# Patient Record
Sex: Female | Born: 2000 | Race: Black or African American | Hispanic: No | Marital: Single | Smoking: Current every day smoker
Health system: Southern US, Community
[De-identification: ages and names within clinical notes are randomized; demographics above are authoritative.]

## PROBLEM LIST (undated history)

## (undated) DIAGNOSIS — F909 Attention-deficit hyperactivity disorder, unspecified type: Secondary | ICD-10-CM

## (undated) DIAGNOSIS — Z593 Problems related to living in residential institution: Secondary | ICD-10-CM

## (undated) DIAGNOSIS — L600 Ingrowing nail: Secondary | ICD-10-CM

## (undated) DIAGNOSIS — I10 Essential (primary) hypertension: Secondary | ICD-10-CM

## (undated) DIAGNOSIS — M6740 Ganglion, unspecified site: Secondary | ICD-10-CM

## (undated) DIAGNOSIS — N760 Acute vaginitis: Secondary | ICD-10-CM

## (undated) DIAGNOSIS — Z3009 Encounter for other general counseling and advice on contraception: Principal | ICD-10-CM

## (undated) DIAGNOSIS — F419 Anxiety disorder, unspecified: Secondary | ICD-10-CM

## (undated) DIAGNOSIS — R443 Hallucinations, unspecified: Secondary | ICD-10-CM

## (undated) DIAGNOSIS — F209 Schizophrenia, unspecified: Secondary | ICD-10-CM

## (undated) DIAGNOSIS — B9689 Other specified bacterial agents as the cause of diseases classified elsewhere: Secondary | ICD-10-CM

## (undated) HISTORY — DX: Ingrowing nail: L60.0

## (undated) HISTORY — DX: Ganglion, unspecified site: M67.40

## (undated) HISTORY — PX: ABDOMINAL SURGERY: SHX537

## (undated) HISTORY — DX: Acute vaginitis: N76.0

## (undated) HISTORY — DX: Anxiety disorder, unspecified: F41.9

## (undated) HISTORY — DX: Other specified bacterial agents as the cause of diseases classified elsewhere: B96.89

## (undated) HISTORY — DX: Problems related to living in residential institution: Z59.3

## (undated) HISTORY — DX: Encounter for other general counseling and advice on contraception: Z30.09

---
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
## 2019-01-14 ENCOUNTER — Other Ambulatory Visit: Payer: Medicaid Other

## 2019-01-14 ENCOUNTER — Other Ambulatory Visit: Payer: Self-pay

## 2019-01-14 DIAGNOSIS — N898 Other specified noninflammatory disorders of vagina: Secondary | ICD-10-CM

## 2019-01-23 LAB — NUSWAB VAGINITIS PLUS (VG+)
Atopobium vaginae: HIGH Score — AB
BVAB 2: HIGH Score — AB
Candida albicans, NAA: NEGATIVE
Candida glabrata, NAA: NEGATIVE
Chlamydia trachomatis, NAA: NEGATIVE
Megasphaera 1: HIGH Score — AB
Neisseria gonorrhoeae, NAA: NEGATIVE
Trich vag by NAA: NEGATIVE

## 2019-01-24 ENCOUNTER — Telehealth: Payer: Self-pay | Admitting: Adult Health

## 2019-01-24 MED ORDER — METRONIDAZOLE 500 MG PO TABS: 500.0000 mg | ORAL_TABLET | Freq: Two times a day (BID) | ORAL | 0 refills | Status: DC

## 2019-01-24 NOTE — Telephone Encounter (Signed)
Left message that nuswab +BV, negative for yeast,trich,GC and chlamydia, rx sent for flagyl to [facility]

## 2019-01-27 ENCOUNTER — Encounter (HOSPITAL_COMMUNITY): Payer: Self-pay | Admitting: Emergency Medicine

## 2019-01-27 ENCOUNTER — Other Ambulatory Visit: Payer: Self-pay

## 2019-01-27 ENCOUNTER — Emergency Department (HOSPITAL_COMMUNITY)
Admission: EM | Admit: 2019-01-27 | Discharge: 2019-01-28 | Disposition: A | Discharge status: Other Acute Inpatient Hospital | Payer: Medicaid Other | Attending: Emergency Medicine | Admitting: Emergency Medicine

## 2019-01-27 DIAGNOSIS — I10 Essential (primary) hypertension: Secondary | ICD-10-CM | POA: Insufficient documentation

## 2019-01-27 DIAGNOSIS — F17210 Nicotine dependence, cigarettes, uncomplicated: Secondary | ICD-10-CM | POA: Diagnosis not present

## 2019-01-27 DIAGNOSIS — R45851 Suicidal ideations: Secondary | ICD-10-CM | POA: Diagnosis not present

## 2019-01-27 DIAGNOSIS — Z03818 Encounter for observation for suspected exposure to other biological agents ruled out: Secondary | ICD-10-CM | POA: Insufficient documentation

## 2019-01-27 DIAGNOSIS — F99 Mental disorder, not otherwise specified: Secondary | ICD-10-CM | POA: Diagnosis present

## 2019-01-27 DIAGNOSIS — F332 Major depressive disorder, recurrent severe without psychotic features: Secondary | ICD-10-CM | POA: Insufficient documentation

## 2019-01-27 DIAGNOSIS — F902 Attention-deficit hyperactivity disorder, combined type: Secondary | ICD-10-CM | POA: Insufficient documentation

## 2019-01-27 LAB — CBC
HCT: 40.2 % (ref 36.0–46.0)
Hemoglobin: 12.4 g/dL (ref 12.0–15.0)
MCH: 27.4 pg (ref 26.0–34.0)
MCHC: 30.8 g/dL (ref 30.0–36.0)
MCV: 88.9 fL (ref 80.0–100.0)
Platelets: 291 10*3/uL (ref 150–400)
RBC: 4.52 MIL/uL (ref 3.87–5.11)
RDW: 14.2 % (ref 11.5–15.5)
WBC: 9.4 10*3/uL (ref 4.0–10.5)
nRBC: 0 % (ref 0.0–0.2)

## 2019-01-27 LAB — ACETAMINOPHEN LEVEL: Acetaminophen (Tylenol), Serum: <10 ug/mL — ABNORMAL LOW (ref 10–30)

## 2019-01-27 LAB — COMPREHENSIVE METABOLIC PANEL
ALT: 23 U/L (ref 0–44)
AST: 22 U/L (ref 15–41)
Albumin: 4.4 g/dL (ref 3.5–5.0)
Alkaline Phosphatase: 69 U/L (ref 38–126)
Anion gap: 9 (ref 5–15)
BUN: 18 mg/dL (ref 6–20)
CO2: 26 mmol/L (ref 22–32)
Calcium: 9.4 mg/dL (ref 8.9–10.3)
Chloride: 106 mmol/L (ref 98–111)
Creatinine, Ser: 0.7 mg/dL (ref 0.44–1.00)
GFR calc Af Amer: >60 mL/min (ref >60)
GFR calc non Af Amer: >60 mL/min (ref >60)
Glucose, Bld: 114 mg/dL — ABNORMAL HIGH (ref 70–99)
Potassium: 3.6 mmol/L (ref 3.5–5.1)
Sodium: 141 mmol/L (ref 135–145)
Total Bilirubin: 0.3 mg/dL (ref 0.3–1.2)
Total Protein: 7.7 g/dL (ref 6.5–8.1)

## 2019-01-27 LAB — ETHANOL: Alcohol, Ethyl (B): <10 mg/dL (ref <10)

## 2019-01-27 LAB — SALICYLATE LEVEL: Salicylate Lvl: <7 mg/dL (ref 2.8–30.0)

## 2019-01-27 NOTE — ED Notes (Signed)
Pt belongings secured in locker in Pt belonging room

## 2019-01-27 NOTE — ED Notes (Signed)
Pt reports she has been unable to take any medications for ADHD and Depression but has been unable to have medications refilled since last year, and her recent prescription for Flagyl was sent to her grandmothers house where she was kicked out of and unable to retrieve her medication. Pt reports she has not seen a psychiatrist in a year and is currently stating she wants to kill herself by overdosing on Tylenol.

## 2019-01-27 NOTE — Progress Notes (Addendum)
Paige Romp, NP recommends inpt tx. [place] to review for possible admission pending Covid testing results per [facility]. Pt's nurse [place], Rudell Cobb, RN and EDP Veryl Speak, MD have been advised.   RN states he is going to order rapid covid testing.  Lind Covert, MSW, LCSW Therapeutic Triage Specialist  [phone]
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
## 2019-01-27 NOTE — ED Triage Notes (Signed)
Pt states she is having suicidal thoughts. States she has a plan to OD on pills.

## 2019-01-27 NOTE — ED Provider Notes (Signed)
[facility] EMERGENCY DEPARTMENT Provider Note   CSN: [id number] Arrival date & time: 01/27/19  2222     History   Chief Complaint Chief Complaint  Patient presents with  . V70.1  . Depression    HPI Paige Wright is a 18 y.o. female.     Patient is a 18 year old female with past medical history of schizophrenia, ADHD, and anxiety.  She presents today for evaluation of suicidal ideation.  She has been off of her medications for several months.  She tells me she has "a lot going on".  She describes issues with finances, family, and personal relationships as stressors.  She denies any drug or alcohol use.  The history is provided by the patient.  Depression This is a recurrent problem. Episode onset: Several days ago. The problem occurs constantly. The problem has been rapidly worsening. Nothing aggravates the symptoms. Nothing relieves the symptoms. She has tried nothing for the symptoms.    Past Medical History:  Diagnosis Date  . ADHD (attention deficit hyperactivity disorder)   . Anxiety   . Bacterial vaginosis 09/06/2014  . Contraceptive education 11/01/2013  . Hallucination   . Hypertension   . Ingrown left greater toenail 05/20/2013  . Lives in group home 10/04/2015  . Schizophrenia [facility](HCC)     Patient Active Problem List   Diagnosis Date Noted  . Encounter for Nexplanon removal 03/18/2017  . Missed periods 03/18/2017  . Death of parent 10/04/2015  . Schizoaffective disorder (HCC) 07/29/2015  . Screening examination for STD (sexually transmitted disease) 11/01/2013  . ADHD (attention deficit hyperactivity disorder), combined type 11/08/2012  . Major depressive disorder, recurrent episode, moderate (HCC) 11/05/2012  . Generalized anxiety disorder 11/05/2012    History reviewed. No pertinent surgical history.   OB History    Gravida  0   Para  0   Term  0   Preterm  0   AB  0   Living  0     SAB  0   TAB  0   Ectopic  0   Multiple  0   Live Births  0            Home Medications    Prior to Admission medications   Medication Sig Start Date End Date Taking? Authorizing Provider  metroNIDAZOLE (FLAGYL) 500 MG tablet Take 1 tablet (500 mg total) by mouth 2 (two) times daily. 01/24/19   Adline PotterGriffin, Jennifer A, NP    Family History Family History  Problem Relation Age of Onset  . HIV/AIDS Mother   . Mental illness Mother   . Hypertension Mother   . Kidney disease Mother   . Mental illness Father   . HIV/AIDS Father   . Asthma Sister   . Diabetes Maternal Grandmother   . Hypertension Maternal Grandmother   . Hypertension Maternal Grandfather   . Mental illness Paternal Grandmother   . Mental illness Paternal Grandfather   . Mental illness Maternal Aunt   . Hypertension Maternal Aunt   . Diabetes Maternal Aunt   . Cancer Maternal Aunt   . Mental illness Paternal Aunt   . Mental illness Paternal Uncle     Social History Social History   Tobacco Use  . Smoking status: Current Some Day Smoker    Types: Cigarettes  . Smokeless tobacco: Never Used  Substance Use Topics  . Alcohol use: Yes    Comment: occ  . Drug use: Not Currently    Types: Marijuana  Allergies   Patient has no known allergies.   Review of Systems Review of Systems  Psychiatric/Behavioral: Positive for depression.  All other systems reviewed and are negative.    Physical Exam Updated Vital Signs BP 135/88 (BP Location: Left Wrist)   Pulse 88   Temp 97.7 F (36.5 C) (Temporal)   Resp 18   Ht 5' 7.5" (1.715 m)   Wt 98.7 kg   LMP 11/20/2018   SpO2 98%   BMI 33.57 kg/m   Physical Exam Vitals signs and nursing note reviewed.  Constitutional:      General: She is not in acute distress.    Appearance: She is well-developed. She is not diaphoretic.  HENT:     Head: Normocephalic and atraumatic.  Neck:     Musculoskeletal: Normal range of motion and neck supple.  Cardiovascular:     Rate and Rhythm: Normal rate and  regular rhythm.     Heart sounds: No murmur. No friction rub. No gallop.   Pulmonary:     Effort: Pulmonary effort is normal. No respiratory distress.     Breath sounds: Normal breath sounds. No wheezing.  Abdominal:     General: Bowel sounds are normal. There is no distension.     Palpations: Abdomen is soft.     Tenderness: There is no abdominal tenderness.  Musculoskeletal: Normal range of motion.  Skin:    General: Skin is warm and dry.  Neurological:     Mental Status: She is alert and oriented to person, place, and time.  Psychiatric:        Attention and Perception: Attention and perception normal.        Mood and Affect: Mood normal.        Speech: Speech normal.        Behavior: Behavior normal.        Thought Content: Thought content includes suicidal ideation. Thought content does not include homicidal ideation. Thought content includes suicidal plan. Thought content does not include homicidal plan.        Cognition and Memory: Cognition normal.        Judgment: Judgment is impulsive.      ED Treatments / Results  Labs (all labs ordered are listed, but only abnormal results are displayed) Labs Reviewed  CBC  COMPREHENSIVE METABOLIC PANEL  ETHANOL  SALICYLATE LEVEL  ACETAMINOPHEN LEVEL  RAPID URINE DRUG SCREEN, HOSP PERFORMED  POC URINE PREG, ED    EKG    Radiology No results found.  Procedures Procedures (including critical care time)  Medications Ordered in ED Medications - No data to display   Initial Impression / Assessment and Plan / ED Course  I have reviewed the triage vital signs and the nursing notes.  Pertinent labs & imaging results that were available during my care of the patient were reviewed by me and considered in my medical decision making (see chart for details).  Patient medically cleared.  She has been evaluated by TTS who feels as though she meets inpatient criteria.  Patient will go to behavioral health for further treatment.   Final Clinical Impressions(s) / ED Diagnoses   Final diagnoses:  None    ED Discharge Orders    None       Veryl Speak, MD 01/28/19 [phone]

## 2019-01-28 ENCOUNTER — Encounter (HOSPITAL_COMMUNITY): Payer: Self-pay

## 2019-01-28 ENCOUNTER — Inpatient Hospital Stay (HOSPITAL_COMMUNITY)
Admission: AD | Admit: 2019-01-28 | Discharge: 2019-02-03 | DRG: 885 | Disposition: A | Discharge status: Home / Self Care | Payer: Medicaid Other | Source: Intra-hospital | Attending: Psychiatry | Admitting: Psychiatry

## 2019-01-28 DIAGNOSIS — Z830 Family history of human immunodeficiency virus [HIV] disease: Secondary | ICD-10-CM

## 2019-01-28 DIAGNOSIS — F1210 Cannabis abuse, uncomplicated: Secondary | ICD-10-CM | POA: Diagnosis present

## 2019-01-28 DIAGNOSIS — Z915 Personal history of self-harm: Secondary | ICD-10-CM | POA: Diagnosis not present

## 2019-01-28 DIAGNOSIS — F332 Major depressive disorder, recurrent severe without psychotic features: Principal | ICD-10-CM | POA: Diagnosis present

## 2019-01-28 DIAGNOSIS — F1010 Alcohol abuse, uncomplicated: Secondary | ICD-10-CM | POA: Diagnosis present

## 2019-01-28 DIAGNOSIS — R45851 Suicidal ideations: Secondary | ICD-10-CM | POA: Diagnosis present

## 2019-01-28 DIAGNOSIS — Z841 Family history of disorders of kidney and ureter: Secondary | ICD-10-CM

## 2019-01-28 DIAGNOSIS — Z8249 Family history of ischemic heart disease and other diseases of the circulatory system: Secondary | ICD-10-CM | POA: Diagnosis not present

## 2019-01-28 DIAGNOSIS — Z818 Family history of other mental and behavioral disorders: Secondary | ICD-10-CM | POA: Diagnosis not present

## 2019-01-28 DIAGNOSIS — Z833 Family history of diabetes mellitus: Secondary | ICD-10-CM

## 2019-01-28 DIAGNOSIS — Z87891 Personal history of nicotine dependence: Secondary | ICD-10-CM

## 2019-01-28 LAB — POC URINE PREG, ED: Preg Test, Ur: NEGATIVE

## 2019-01-28 LAB — RAPID URINE DRUG SCREEN, HOSP PERFORMED
Amphetamines: NOT DETECTED
Barbiturates: NOT DETECTED
Benzodiazepines: NOT DETECTED
Cocaine: NOT DETECTED
Opiates: NOT DETECTED
Tetrahydrocannabinol: POSITIVE — AB

## 2019-01-28 LAB — SARS CORONAVIRUS 2 BY RT PCR (HOSPITAL ORDER, PERFORMED IN ~~LOC~~ HOSPITAL LAB): SARS Coronavirus 2: NEGATIVE

## 2019-01-28 MED ORDER — HYDROXYZINE HCL 25 MG PO TABS
25.0000 mg | ORAL_TABLET | Freq: Four times a day (QID) | ORAL | Status: AC | PRN
  Administered 2019-01-28 – 2019-01-29 (×3): 25 mg via ORAL
  Filled 2019-01-28 (×4): qty 1

## 2019-01-28 MED ORDER — VITAMIN B-1 100 MG PO TABS
100.0000 mg | ORAL_TABLET | Freq: Every day | ORAL | Status: DC
  Administered 2019-01-29 – 2019-02-03 (×6): 100 mg via ORAL
  Filled 2019-01-28 (×9): qty 1

## 2019-01-28 MED ORDER — LOPERAMIDE HCL 2 MG PO CAPS: 2.0000 mg | ORAL_CAPSULE | ORAL | Status: AC | PRN

## 2019-01-28 MED ORDER — LORAZEPAM 1 MG PO TABS: 1.0000 mg | ORAL_TABLET | Freq: Four times a day (QID) | ORAL | Status: AC | PRN

## 2019-01-28 MED ORDER — THIAMINE HCL 100 MG/ML IJ SOLN: 100.0000 mg | Freq: Once | INTRAMUSCULAR | Status: DC

## 2019-01-28 MED ORDER — ALUM & MAG HYDROXIDE-SIMETH 200-200-20 MG/5ML PO SUSP: 30.0000 mL | ORAL | Status: DC | PRN

## 2019-01-28 MED ORDER — MAGNESIUM HYDROXIDE 400 MG/5ML PO SUSP
30.0000 mL | Freq: Every day | ORAL | Status: DC | PRN
  Administered 2019-01-29 – 2019-01-30 (×2): 30 mL via ORAL
  Filled 2019-01-28 (×2): qty 30

## 2019-01-28 MED ORDER — SERTRALINE HCL 25 MG PO TABS
25.0000 mg | ORAL_TABLET | Freq: Every day | ORAL | Status: DC
  Administered 2019-01-28 – 2019-01-30 (×3): 25 mg via ORAL
  Filled 2019-01-28 (×7): qty 1

## 2019-01-28 MED ORDER — ADULT MULTIVITAMIN W/MINERALS CH
1.0000 | ORAL_TABLET | Freq: Every day | ORAL | Status: DC
  Administered 2019-01-28 – 2019-02-03 (×7): 1 via ORAL
  Filled 2019-01-28 (×10): qty 1

## 2019-01-28 MED ORDER — TRAZODONE HCL 50 MG PO TABS: 50.0000 mg | ORAL_TABLET | Freq: Every evening | ORAL | Status: DC | PRN

## 2019-01-28 MED ORDER — ONDANSETRON 4 MG PO TBDP: 4.0000 mg | ORAL_TABLET | Freq: Four times a day (QID) | ORAL | Status: AC | PRN

## 2019-01-28 MED ORDER — ACETAMINOPHEN 325 MG PO TABS: 650.0000 mg | ORAL_TABLET | Freq: Four times a day (QID) | ORAL | Status: DC | PRN

## 2019-01-28 MED ORDER — HYDROXYZINE HCL 25 MG PO TABS: 25.0000 mg | ORAL_TABLET | Freq: Three times a day (TID) | ORAL | Status: DC | PRN
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
## 2019-01-28 NOTE — Progress Notes (Signed)
Pt accepted to [facility] [id number] to Dr. Parke Poisson, MD. Call to report [phone]. Bed is ready now. ED staff advised.  Lind Covert, MSW, LCSW Therapeutic Triage Specialist  [phone]

## 2019-01-28 NOTE — Tx Team (Signed)
Interdisciplinary Treatment and Diagnostic Plan Update  01/28/2019 Time of Session:  Paige Wright MRN: [id number]  Principal Diagnosis: <principal problem not specified>  Secondary Diagnoses: Active Problems:   Severe recurrent major depression without psychotic features (HCC)   Current Medications:  Current Facility-Administered Medications  Medication Dose Route Frequency Provider Last Rate Last Dose  . acetaminophen (TYLENOL) tablet 650 mg  650 mg Oral Q6H PRN Rozetta Nunnery, NP      . alum & mag hydroxide-simeth (MAALOX/MYLANTA) 200-200-20 MG/5ML suspension 30 mL  30 mL Oral Q4H PRN Lindon Romp A, NP      . hydrOXYzine (ATARAX/VISTARIL) tablet 25 mg  25 mg Oral TID PRN Lindon Romp A, NP      . magnesium hydroxide (MILK OF MAGNESIA) suspension 30 mL  30 mL Oral Daily PRN Lindon Romp A, NP      . traZODone (DESYREL) tablet 50 mg  50 mg Oral QHS PRN Lindon Romp A, NP       PTA Medications: No medications prior to admission.    Patient Stressors: Marital or family conflict Occupational concerns  Patient Strengths: Average or above average intelligence Supportive family/friends  Treatment Modalities: Medication Management, Group therapy, Case management,  1 to 1 session with clinician, Psychoeducation, Recreational therapy.   Physician Treatment Plan for Primary Diagnosis: <principal problem not specified> Long Term Goal(s):     Short Term Goals:    Medication Management: Evaluate patient's response, side effects, and tolerance of medication regimen.  Therapeutic Interventions: 1 to 1 sessions, Unit Group sessions and Medication administration.  Evaluation of Outcomes: Not Met  Physician Treatment Plan for Secondary Diagnosis: Active Problems:   Severe recurrent major depression without psychotic features ([facility])  Long Term Goal(s):     Short Term Goals:       Medication Management: Evaluate patient's response, side effects, and tolerance of medication  regimen.  Therapeutic Interventions: 1 to 1 sessions, Unit Group sessions and Medication administration.  Evaluation of Outcomes: Not Met   RN Treatment Plan for Primary Diagnosis: <principal problem not specified> Long Term Goal(s): Knowledge of disease and therapeutic regimen to maintain health will improve  Short Term Goals: Ability to participate in decision making will improve, Ability to verbalize feelings will improve, Ability to disclose and discuss suicidal ideas, Ability to identify and develop effective coping behaviors will improve and Compliance with prescribed medications will improve  Medication Management: RN will administer medications as ordered by provider, will assess and evaluate patient's response and provide education to patient for prescribed medication. RN will report any adverse and/or side effects to prescribing provider.  Therapeutic Interventions: 1 on 1 counseling sessions, Psychoeducation, Medication administration, Evaluate responses to treatment, Monitor vital signs and CBGs as ordered, Perform/monitor CIWA, COWS, AIMS and Fall Risk screenings as ordered, Perform wound care treatments as ordered.  Evaluation of Outcomes: Not Met   LCSW Treatment Plan for Primary Diagnosis: <principal problem not specified> Long Term Goal(s): Safe transition to appropriate next level of care at discharge, Engage patient in therapeutic group addressing interpersonal concerns.  Short Term Goals: Engage patient in aftercare planning with referrals and resources  Therapeutic Interventions: Assess for all discharge needs, 1 to 1 time with Social worker, Explore available resources and support systems, Assess for adequacy in community support network, Educate family and significant other(s) on suicide prevention, Complete Psychosocial Assessment, Interpersonal group therapy.  Evaluation of Outcomes: Not Met   Progress in Treatment: Attending groups: No. Participating in  groups: No. Taking medication  as prescribed: Yes. Toleration medication: Yes. Family/Significant other contact made: No, will contact:  the patient's grandmother Patient understands diagnosis: Yes. Discussing patient identified problems/goals with staff: Yes. Medical problems stabilized or resolved: Yes. Denies suicidal/homicidal ideation: Yes. Issues/concerns per patient self-inventory: No. Other:   New problem(s) identified:None   New Short Term/Long Term Goal(s): medication stabilization, elimination of SI thoughts, development of comprehensive mental wellness plan.    Patient Goals:    Discharge Plan or Barriers: CSW will continue to follow and assess for appropriate referrals and possible discharge planning.   Reason for Continuation of Hospitalization: Anxiety Depression Medication stabilization Suicidal ideation  Estimated Length of Stay: 3-5 days   Attendees: Patient: 01/28/2019 11:26 AM  Physician: Dr. Neita Garnet, MD 01/28/2019 11:26 AM  Nursing: Sharl Ma.Viona Gilmore, RN 01/28/2019 11:26 AM  RN Care Manager: 01/28/2019 11:26 AM  Social Worker: Radonna Ricker, [place] 01/28/2019 11:26 AM  Recreational Therapist:  01/28/2019 11:26 AM  Other:  01/28/2019 11:26 AM  Other:  01/28/2019 11:26 AM  Other: 01/28/2019 11:26 AM    Scribe for Treatment Team: Marylee Floras, [place] 01/28/2019 11:26 AM

## 2019-01-28 NOTE — Progress Notes (Signed)
Writer spoke with patient 1:1 after she finished up on the phone. She reports her day as being ok and spoke about her boyfriend who she is no longer going to date because he is not good for her. She inquired about her medications and returned to the dayroom. Safety maintained on unit with 15 min checks.
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
## 2019-01-28 NOTE — Tx Team (Signed)
Initial Treatment Plan 01/28/2019 4:22 AM Lerin K Alroy Dust SFK:[id number]    PATIENT STRESSORS: Marital or family conflict Occupational concerns   PATIENT STRENGTHS: Average or above average intelligence Supportive family/friends   PATIENT IDENTIFIED PROBLEMS: Depression  Homelessness                   DISCHARGE CRITERIA:  Improved stabilization in mood, thinking, and/or behavior Verbal commitment to aftercare and medication compliance  PRELIMINARY DISCHARGE PLAN: Outpatient therapy  PATIENT/FAMILY INVOLVEMENT: This treatment plan has been presented to and reviewed with the patient, Paige Wright, .  The patient and family have been given the opportunity to ask questions and make suggestions.  Isabella Bowens, RN 01/28/2019, 4:22 AM

## 2019-01-28 NOTE — Progress Notes (Signed)
Pt presents with a flat affect and a irritable mood. Pt rates depression 8/10, anxiety 8/10, and, hopelessness 8/10. Pt denies SI/HI. Pt denies AVH. Pt reported difficulty sleeping last night. Pt denies racing thoughts and nightmares at hs.   Orders reviewed with pt. Verbals support provided. Pt encouraged to attend groups. 15 minute checks performed for safety.  Pt receptive to tx plan.   [place] NOVEL CORONAVIRUS (COVID-19) DAILY CHECK-OFF SYMPTOMS - answer yes or no to each - every day NO YES  Have you had a fever in the past 24 hours?  . Fever (Temp > 37.80C / 100F) X   Have you had any of these symptoms in the past 24 hours? . New Cough .  Sore Throat  .  Shortness of Breath .  Difficulty Breathing .  Unexplained Body Aches   X   Have you had any one of these symptoms in the past 24 hours not related to allergies?   . Runny Nose .  Nasal Congestion .  Sneezing   X   If you have had runny nose, nasal congestion, sneezing in the past 24 hours, has it worsened?  X   EXPOSURES - check yes or no X   Have you traveled outside the state in the past 14 days?  X   Have you been in contact with someone with a confirmed diagnosis of COVID-19 or PUI in the past 14 days without wearing appropriate PPE?  X   Have you been living in the same home as a person with confirmed diagnosis of COVID-19 or a PUI (household contact)?    X   Have you been diagnosed with COVID-19?    X              What to do next: Answered NO to all: Answered YES to anything:   Proceed with unit schedule Follow the [facility] Inpatient Flowsheet.

## 2019-01-28 NOTE — Plan of Care (Signed)
Paige Wright admitted to [facility] for SI. Patient and staff will develop a treatment plan.

## 2019-01-29 DIAGNOSIS — Z915 Personal history of self-harm: Secondary | ICD-10-CM

## 2019-01-29 MED ORDER — NICOTINE POLACRILEX 2 MG MT GUM
2.0000 mg | CHEWING_GUM | OROMUCOSAL | Status: DC | PRN
  Administered 2019-01-30 – 2019-02-01 (×4): 2 mg via ORAL
  Filled 2019-01-29 (×3): qty 1

## 2019-01-29 NOTE — Plan of Care (Signed)
D: Pt alert and oriented on the unit. Pt engaging with other pts in the dayroom and hallway. Pt denies SI/HI, A/VH and participated during unit groups and activities. Pt is cooperative.  A: Education, support and encouragement provided, q15 minute safety checks remain in effect. Medications administered per MD orders.  R: No reactions/side effects to medicine noted. Pt denies any concerns at this time, and verbally contracts for safety. Pt ambulating on the unit with no issues. Pt remains safe on and off the unit.   Problem: Activity: Goal: Interest or engagement in leisure activities will improve Outcome: Progressing   Problem: Coping: Goal: Coping ability will improve Outcome: Progressing
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
## 2019-01-29 NOTE — Progress Notes (Signed)
Patient ID: Paige Wright, female   DOB: Jul 06, 2001, 18 y.o.   MRN: [id number]  [place] NOVEL CORONAVIRUS (COVID-19) DAILY CHECK-OFF SYMPTOMS - answer yes or no to each - every day NO YES  Have you had a fever in the past 24 hours?  . Fever (Temp > 37.80C / 100F) X   Have you had any of these symptoms in the past 24 hours? . New Cough .  Sore Throat  .  Shortness of Breath .  Difficulty Breathing .  Unexplained Body Aches   X   Have you had any one of these symptoms in the past 24 hours not related to allergies?   . Runny Nose .  Nasal Congestion .  Sneezing   X   If you have had runny nose, nasal congestion, sneezing in the past 24 hours, has it worsened?  X   EXPOSURES - check yes or no X   Have you traveled outside the state in the past 14 days?  X   Have you been in contact with someone with a confirmed diagnosis of COVID-19 or PUI in the past 14 days without wearing appropriate PPE?  X   Have you been living in the same home as a person with confirmed diagnosis of COVID-19 or a PUI (household contact)?    X   Have you been diagnosed with COVID-19?    X              What to do next: Answered NO to all: Answered YES to anything:   Proceed with unit schedule Follow the [facility] Inpatient Flowsheet.

## 2019-01-29 NOTE — Progress Notes (Signed)
Patient ID: Paige Wright, female   DOB: [birth year], 18 y.o.   MRN: [id number]   Pt refused vital signs this morning.

## 2019-01-30 MED ORDER — TRAZODONE HCL 50 MG PO TABS
50.0000 mg | ORAL_TABLET | Freq: Every evening | ORAL | Status: DC | PRN
  Administered 2019-01-30: 50 mg via ORAL
  Filled 2019-01-30 (×5): qty 1

## 2019-01-30 MED ORDER — SERTRALINE HCL 50 MG PO TABS
50.0000 mg | ORAL_TABLET | Freq: Every day | ORAL | Status: DC
  Administered 2019-01-31 – 2019-02-03 (×4): 50 mg via ORAL
  Filled 2019-01-30 (×7): qty 1

## 2019-01-30 NOTE — Progress Notes (Signed)
DAR NOTE: Patient presents with calm  affect and pleasant mood.  Denies pain, auditory and visual hallucinations.  Maintained on routine safety checks.  Medications given as prescribed.  Support and encouragement offered as needed.  Attended group and participated.  Will continue to monitor. 

## 2019-01-30 NOTE — Progress Notes (Addendum)
[facility] MD Progress Note  01/30/2019 3:29 PM Paige Wright  MRN:  [id number]  Subjective: Paige Wright reported " have to learn to stop questioning myself."  Objective: Patient is awake alert and oriented x3.  Observed interacting with peers in the day room.  She is currently denying suicidal or homicidal ideations.  Denies auditory visual hallucinations.  Patient slightly guarded and mood irritability throughout this assessment.  Rates her depression 5 out of 10 with 10 being the worst.  Discussed titration to Zoloft.  Patient was receptive to plan.  Reported attending daily group sessions with active and engaged participation.  Denies alcohol withdrawal symptoms and/or cravings.  Reports a good appetite.  States she is resting well throughout the night.  Support, encouragement and reassurance was provided.  Principal Problem: Severe recurrent major depression without psychotic features ([place]) Diagnosis: Principal Problem:   Severe recurrent major depression without psychotic features ([place])  Total Time spent with patient: 20 minutes  Past Psychiatric History:    Past Medical History:  Past Medical History:  Diagnosis Date  . ADHD (attention deficit hyperactivity disorder)   . Anxiety   . Bacterial vaginosis 09/06/2014  . Contraceptive education 11/01/2013  . Hallucination   . Hypertension   . Ingrown left greater toenail 05/20/2013  . Lives in group home 10/04/2015  . Schizophrenia ([place])    History reviewed. No pertinent surgical history. Family History:  Family History  Problem Relation Age of Onset  . HIV/AIDS Mother   . Mental illness Mother   . Hypertension Mother   . Kidney disease Mother   . Mental illness Father   . HIV/AIDS Father   . Asthma Sister   . Diabetes Maternal Grandmother   . Hypertension Maternal Grandmother   . Hypertension Maternal Grandfather   . Mental illness Paternal Grandmother   . Mental illness Paternal Grandfather   . Mental illness Maternal Aunt    . Hypertension Maternal Aunt   . Diabetes Maternal Aunt   . Cancer Maternal Aunt   . Mental illness Paternal Aunt   . Mental illness Paternal Uncle    Family Psychiatric  History:  both parents deceased , mother died from complications of AIDS, father died in a vehicular accident when patient was a young child. Has three sisters .   Social History:  Social History   Substance and Sexual Activity  Alcohol Use Yes   Comment: occ     Social History   Substance and Sexual Activity  Drug Use Yes  . Types: Marijuana    Social History   Socioeconomic History  . Marital status: Single    Spouse name: Not on file  . Number of children: Not on file  . Years of education: Not on file  . Highest education level: Not on file  Occupational History  . Not on file  Social Needs  . Financial resource strain: Not on file  . Food insecurity    Worry: Not on file    Inability: Not on file  . Transportation needs    Medical: Not on file    Non-medical: Not on file  Tobacco Use  . Smoking status: Former Smoker    Types: Cigarettes  . Smokeless tobacco: Never Used  Substance and Sexual Activity  . Alcohol use: Yes    Comment: occ  . Drug use: Yes    Types: Marijuana  . Sexual activity: Yes    Birth control/protection: None  Lifestyle  . Physical activity    Days  per week: Not on file    Minutes per session: Not on file  . Stress: Not on file  Relationships  . Social Musicianconnections    Talks on phone: Not on file    Gets together: Not on file    Attends religious service: Not on file    Active member of club or organization: Not on file    Attends meetings of clubs or organizations: Not on file    Relationship status: Not on file  Other Topics Concern  . Not on file  Social History Narrative   Lives with GM and cousins,  And sister   No smokesrs   Formerly in [facility] 2016-2018   Additional Social History:                         Sleep: Fair  Appetite:   Fair  Current Medications: Current Facility-Administered Medications  Medication Dose Route Frequency Provider Last Rate Last Dose  . acetaminophen (TYLENOL) tablet 650 mg  650 mg Oral Q6H PRN Nira ConnBerry, Jason A, NP      . alum & mag hydroxide-simeth (MAALOX/MYLANTA) 200-200-20 MG/5ML suspension 30 mL  30 mL Oral Q4H PRN Nira ConnBerry, Jason A, NP      . hydrOXYzine (ATARAX/VISTARIL) tablet 25 mg  25 mg Oral Q6H PRN Cobos, Rockey SituFernando A, MD   25 mg at 01/29/19 2124  . loperamide (IMODIUM) capsule 2-4 mg  2-4 mg Oral PRN Cobos, Rockey SituFernando A, MD      . LORazepam (ATIVAN) tablet 1 mg  1 mg Oral Q6H PRN Cobos, Fernando A, MD      . magnesium hydroxide (MILK OF MAGNESIA) suspension 30 mL  30 mL Oral Daily PRN Nira ConnBerry, Jason A, NP   30 mL at 01/30/19 0820  . multivitamin with minerals tablet 1 tablet  1 tablet Oral Daily Cobos, Rockey SituFernando A, MD   1 tablet at 01/30/19 0820  . nicotine polacrilex (NICORETTE) gum 2 mg  2 mg Oral PRN Cobos, Rockey SituFernando A, MD   2 mg at 01/30/19 1457  . ondansetron (ZOFRAN-ODT) disintegrating tablet 4 mg  4 mg Oral Q6H PRN Cobos, Rockey SituFernando A, MD      . Melene Muller[START ON 01/31/2019] sertraline (ZOLOFT) tablet 50 mg  50 mg Oral Daily Oneta RackLewis, Tanika N, NP      . thiamine (B-1) injection 100 mg  100 mg Intramuscular Once Cobos, Fernando A, MD      . thiamine (VITAMIN B-1) tablet 100 mg  100 mg Oral Daily Cobos, Rockey SituFernando A, MD   100 mg at 01/30/19 0820  . traZODone (DESYREL) tablet 50 mg  50 mg Oral QHS,MR X 1 Oneta RackLewis, Tanika N, NP        Lab Results:  No results found for this or any previous visit (from the past 48 hour(s)).  Blood Alcohol level:  Lab Results  Component [facility] Date   [facility]   ETH <11 04/12/2014    Metabolic Disorder Labs: Lab Results  Component Value Date   HGBA1C 5.9 (H) 11/05/2012   MPG 123 (H) 11/05/2012   Lab Results  Component Value Date   PROLACTIN 35.1 11/05/2012   Lab Results  Component Value Date   CHOL 131 11/05/2012   TRIG 63 11/05/2012   HDL 40  11/05/2012   CHOLHDL 3.3 11/05/2012   VLDL 13 11/05/2012   LDLCALC 78 11/05/2012    Physical Findings: AIMS:  , ,  ,  ,  CIWA:  CIWA-Ar Total: 0 COWS:     Musculoskeletal: Strength & Muscle Tone: within normal limits Gait & Station: normal Patient leans: N/A  Psychiatric Specialty Exam: Physical Exam  Nursing note and vitals reviewed. Constitutional: She is oriented to person, place, and time.  Neurological: She is alert and oriented to person, place, and time.  Psychiatric: She has a normal mood and affect. Her behavior is normal.    Review of Systems  Psychiatric/Behavioral: Positive for depression and substance abuse. Negative for hallucinations, memory loss and suicidal ideas. The patient is not nervous/anxious and does not have insomnia.   All other systems reviewed and are negative.   Blood pressure 109/70, pulse (!) 105, temperature 97.9 F (36.6 C), temperature source Oral, resp. rate 20, height 5\' 7"  (1.702 m), weight 98 kg, last menstrual period 11/28/2018, SpO2 (!) 73 %.Body mass index is 33.83 kg/m.  General Appearance: Fairly Groomed  Eye Contact:  Good  Speech:  Clear and Coherent and Normal Rate  Volume:  Normal  Mood:  Depressed  Affect:  Non-Congruent  Thought Process:  Coherent, Goal Directed, Linear and Descriptions of Associations: Intact  Orientation:  Full (Time, Place, and Person)  Thought Content:  WDL  Suicidal Thoughts:  No  Homicidal Thoughts:  No  Memory:  Immediate;   Fair Recent;   Fair  Judgement:  Fair  Insight:  Fair  Psychomotor Activity:  Normal  Concentration:  Concentration: Fair and Attention Span: Fair  Recall:  [employer]Fair  Fund of Knowledge:  Fair  Language:  Good  Akathisia:  Negative  Handed:  Right  AIMS (if indicated):     Assets:  Communication Skills Desire for Improvement Social Support  ADL's:  Intact  Cognition:  WNL  Sleep:  Number of Hours: 6     Treatment Plan Summary:  Daily contact with patient to assess  and evaluate symptoms and progress in treatment and Medication management   Continue with current treatment plan on 01/30/2019 as listed below except were noted  Major depressive disorder:  Increased Zoloft 25 mg to 50 mg p.o. daily Continue trazodone 50 mg p.o. nightly as needed  Alcohol protocol/ CIWAs Continue Ativan as needed   CSW to continue working on discharge disposition Patient encouraged to participate within the therapeutic milieu  Oneta Rackanika N Lewis, NP 01/30/2019, 3:29 PM   Attest to NP progress note

## 2019-01-30 NOTE — Progress Notes (Signed)
Patient has been observed in the dayroom on the phone after using the phone in the hallway. She has very little to say and seems irritated about something but reports that she does not want to talk. Support given and safety maintained on unit with 15 min checks.

## 2019-01-30 NOTE — Progress Notes (Signed)
Adult Psychoeducational Group Note  Date:  01/30/2019 Time:  9:29 PM  Group Topic/Focus:  Wrap-Up Group:   The focus of this group is to help patients review their daily goal of treatment and discuss progress on daily workbooks.  Participation Level:  Active  Participation Quality:  Appropriate and Attentive  Affect:  Appropriate  Cognitive:  Alert, Appropriate and Oriented  Insight: Appropriate  Engagement in Group:  Engaged  Modes of Intervention:  Discussion and Education  Additional Comments:  Pt attended and participated in group. Pt stated her goal today was to not judge her own feelings. Pt reported completing her goal and rated her day a 5/10.  Milus Glazier 01/30/2019, 9:29 PM

## 2019-01-30 NOTE — [facility] Group Notes (Signed)
[place] LCSW Group Therapy Note  01/30/2019   10:00-11:00AM  Type of Therapy and Topic:  Group Therapy:  Unhealthy versus Healthy Supports, Which Am I?  Participation Level:  Did Not Attend   Description of Group:  Patients in this group were introduced to the concept that additional supports including self-support are an essential part of recovery.  Initially a discussion was held about the differences between healthy versus unhealthy supports.  Patients were asked to share what unhealthy supports in their lives need to be addressed, as well as what additional healthy supports could be added for greater help in reaching their goals.   A song entitled "My Own Hero" was played and a group discussion ensued in which patients stated they could relate to the song and it inspired them to realize they have be willing to help themselves in order to succeed, because other people cannot achieve sobriety or stability for them.  We discussed adding a variety of healthy supports to address the various needs in patient lives, including becoming more self-supportive.  A song was played called "I Know Where I've Been" toward the end of group and used to conduct an inspirational wrap-up to group of remembering how far they have already come in their journey.  Therapeutic Goals: 1)  Highlight the differences between healthy and unhealthy supports 2)  Suggest the importance of being a part of one's own support system 2)  Discuss reasons people in one's life may eventually be unable to be continually supportive  3)  Identify the patient's current support system   4) Elicit commitments to add healthy supports and to become more conscious of being self-supportive   Summary of Patient Progress:  Did not attend - was present for first five minutes of group while we discussed the difference in healthy and unhealthy supports, but left the room in an agitated state and did not return.  Therapeutic Modalities:   Motivational  Interviewing Activity  Maretta Los , MSW, LCSW

## 2019-01-30 NOTE — Progress Notes (Signed)
DAR NOTE: Patient presents with anxious affect and depressed mood. Denies suicidal thoughts, auditory and visual hallucinations.  Described energy level as normal and concentration as good.  Rates depression at 5, hopelessness at 0, and anxiety at 4.  Maintained on routine safety checks.  Medications given as prescribed.  Support and encouragement offered as needed.  Attended group and participated.  Patient observed socializing with peers in the dayroom.  Requested and received MOM for complain of constipation. Result pending.  Patient safe on and off the unit.

## 2019-01-31 MED ORDER — TRAZODONE HCL 50 MG PO TABS
50.0000 mg | ORAL_TABLET | Freq: Every evening | ORAL | Status: DC | PRN
  Administered 2019-01-31 – 2019-02-02 (×3): 50 mg via ORAL
  Filled 2019-01-31 (×2): qty 1

## 2019-01-31 MED ORDER — ATOMOXETINE HCL 18 MG PO CAPS
18.0000 mg | ORAL_CAPSULE | Freq: Every day | ORAL | Status: DC
  Administered 2019-01-31 – 2019-02-02 (×3): 18 mg via ORAL
  Filled 2019-01-31 (×6): qty 1

## 2019-01-31 MED ORDER — HYDROXYZINE HCL 25 MG PO TABS
25.0000 mg | ORAL_TABLET | Freq: Three times a day (TID) | ORAL | Status: DC | PRN
  Administered 2019-02-03: 25 mg via ORAL
  Filled 2019-01-31: qty 1

## 2019-01-31 NOTE — Progress Notes (Signed)
DAR NOTE: Patient presents with anxious affect and mood.  Denies suicidal thoughts, pain, auditory and visual hallucinations.  Described energy level as normal and concentration as good.  Rates depression at 0, hopelessness at 0, and anxiety at 0.  Maintained on routine safety checks.  Medications given as prescribed.  Support and encouragement offered as needed.  Attended group and participated.  States goal for today is "discharge."  Patient observed socializing with peers in the dayroom.  Offered no complaint.

## 2019-01-31 NOTE — Progress Notes (Signed)
   01/31/19 2105  COVID-19 Daily Checkoff  Have you had a fever (temp > 37.80C/100F)  in the past 24 hours?  No  COVID-19 EXPOSURE  Have you traveled outside the state in the past 14 days? No  Have you been in contact with someone with a confirmed diagnosis of COVID-19 or PUI in the past 14 days without wearing appropriate PPE? No  Have you been living in the same home as a person with confirmed diagnosis of COVID-19 or a PUI (household contact)? No  Have you been diagnosed with COVID-19? No
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
## 2019-02-01 NOTE — Progress Notes (Signed)
D:  Paige Wright was up and visible on the unit.  She was noted interacting well with staff and peers.  She denied SI/HI or A/V hallucinations.  She reported her day was ok but doesn't know what the plan for discharge is at this time.  She requested trazodone for sleep tonight because she felt that it was very helpful last night.   A:  1:1 with RN for support and encouragement.  Medications as ordered.  Q 15 minute checks maintained for safety.  Encouraged participation in group and unit activities.   R:  Paige Wright remains safe on the unit.  We will continue to monitor the progress towards her goals.

## 2019-02-01 NOTE — Progress Notes (Signed)
D:  Patient denied SI and HI, contracts for safety.  Denied A/V hallucinations.   A:  Medications administered per MD orders.  Emotional support and encouragement given patient. R:  Safety maintained with 15 minute checks.  

## 2019-02-01 NOTE — Progress Notes (Signed)
The patient's positive event for the day is that she simply made it through the day. Her goal for tomorrow is to "have more positive thoughts".

## 2019-02-01 NOTE — Progress Notes (Signed)
[facility]  02/01/2019 2:39 PM Daniyah Owens SharkK [place]  MRN:  [id number] Subjective: Patient is a 18 year old female with a past psychiatric history significant for depression, attention deficit hyperactivity disorder, alcohol use disorder as well as cannabis use disorder.  She was admitted on 01/27/2025 secondary to suicidal ideation and thoughts of overdosing on an over-the-counter medication.  Objective: Patient is seen and examined.  Patient is a 18 year old female with the above-stated past psychiatric history who is seen in follow-up.  She stated that she is slowly getting better.  Her irritability does continue.  We discussed the fact that she felt as though part of her trigger mechanism was her grandmother waking her up in the morning.  She denied any side effects to her current medications.  She denied any suicidal ideation.  We discussed the possibility that her grandmother might not allow her to return home, but she stated that she will call her and discuss it with her so that she would have a place to go to after discharge.  Her vital signs are stable, she is afebrile.  Her last CIWA was 1 today.  She seemed to minimize her alcohol intake.  She slept 6.75 hours last night.  Review of her laboratories revealed a mildly elevated glucose, negative pregnancy test, alcohol and salicylate were negative, and drug screen was positive only for marijuana.  Principal Problem: Severe recurrent major depression without psychotic features ([facility]) Diagnosis: Principal Problem:   Severe recurrent major depression without psychotic features ([facility])  Total Time spent with patient: 15 minutes  Past Psychiatric History: See admission H&P  Past Medical History:  Past Medical History:  Diagnosis Date  . ADHD (attention deficit hyperactivity disorder)   . Anxiety   . Bacterial vaginosis 09/06/2014  . Contraceptive education 11/01/2013  . Hallucination   . Hypertension   . Ingrown left greater toenail  05/20/2013  . Lives in group home 10/04/2015  . Schizophrenia ([facility])    History reviewed. No pertinent surgical history. Family History:  Family History  Problem Relation Age of Onset  . HIV/AIDS Mother   . Mental illness Mother   . Hypertension Mother   . Kidney disease Mother   . Mental illness Father   . HIV/AIDS Father   . Asthma Sister   . Diabetes Maternal Grandmother   . Hypertension Maternal Grandmother   . Hypertension Maternal Grandfather   . Mental illness Paternal Grandmother   . Mental illness Paternal Grandfather   . Mental illness Maternal Aunt   . Hypertension Maternal Aunt   . Diabetes Maternal Aunt   . Cancer Maternal Aunt   . Mental illness Paternal Aunt   . Mental illness Paternal Uncle    Family Psychiatric  History: See admission H&P Social History:  Social History   Substance and Sexual Activity  Alcohol Use Yes   Comment: occ     Social History   Substance and Sexual Activity  Drug Use Yes  . Types: Marijuana    Social History   Socioeconomic History  . Marital status: Single    Spouse name: Not on file  . Number of children: Not on file  . Years of education: Not on file  . Highest education level: Not on file  Occupational History  . Not on file  Social Needs  . Financial resource strain: Not on file  . Food insecurity    Worry: Not on file    Inability: Not on file  . Transportation needs  Medical: Not on file    Non-medical: Not on file  Tobacco Use  . Smoking status: Former Smoker    Types: Cigarettes  . Smokeless tobacco: Never Used  Substance and Sexual Activity  . Alcohol use: Yes    Comment: occ  . Drug use: Yes    Types: Marijuana  . Sexual activity: Yes    Birth control/protection: None  Lifestyle  . Physical activity    Days per week: Not on file    Minutes per session: Not on file  . Stress: Not on file  Relationships  . Social Herbalist on phone: Not on file    Gets together: Not on file     Attends religious service: Not on file    Active member of club or organization: Not on file    Attends meetings of clubs or organizations: Not on file    Relationship status: Not on file  Other Topics Concern  . Not on file  Social History Narrative   Lives with GM and cousins,  And sister   No smokesrs   Formerly in [facility] 2016-[facility]   Additional Social History:                         Sleep: Good  Appetite:  Good  Current Medications: Current Facility-Administered Medications  Medication Dose Route Frequency Provider Last Rate Last Dose  . acetaminophen (TYLENOL) tablet 650 mg  650 mg Oral Q6H PRN Lindon Romp A, NP      . alum & mag hydroxide-simeth (MAALOX/MYLANTA) 200-200-20 MG/5ML suspension 30 mL  30 mL Oral Q4H PRN Lindon Romp A, NP      . atomoxetine (STRATTERA) capsule 18 mg  18 mg Oral Daily Cobos, Myer Peer, MD   18 mg at 02/01/19 0804  . hydrOXYzine (ATARAX/VISTARIL) tablet 25 mg  25 mg Oral TID PRN Lindon Romp A, NP      . magnesium hydroxide (MILK OF MAGNESIA) suspension 30 mL  30 mL Oral Daily PRN Lindon Romp A, NP   30 mL at 01/30/19 0820  . multivitamin with minerals tablet 1 tablet  1 tablet Oral Daily Cobos, Myer Peer, MD   1 tablet at 02/01/19 0805  . nicotine polacrilex (NICORETTE) gum 2 mg  2 mg Oral PRN Cobos, Myer Peer, MD   2 mg at 01/30/19 1457  . sertraline (ZOLOFT) tablet 50 mg  50 mg Oral Daily Derrill Center, NP   50 mg at 02/01/19 0805  . thiamine (B-1) injection 100 mg  100 mg Intramuscular Once Cobos, Fernando A, MD      . thiamine (VITAMIN B-1) tablet 100 mg  100 mg Oral Daily Cobos, Myer Peer, MD   100 mg at 02/01/19 0805  . traZODone (DESYREL) tablet 50 mg  50 mg Oral QHS PRN Cobos, Myer Peer, MD   50 mg at 01/31/19 2104    Lab Results: No results found for this or any previous visit (from the past 90 hour(s)).  Blood Alcohol level:  Lab Results  Component Value Date   [facility] <10 01/27/2019   ETH <11 02/58/5277     Metabolic Disorder Labs: Lab Results  Component Value Date   HGBA1C 5.9 (H) 11/05/2012   MPG 123 (H) 11/05/2012   Lab Results  Component Value Date   PROLACTIN 35.1 11/05/2012   Lab Results  Component Value Date   CHOL 131 11/05/2012   TRIG 63 11/05/2012  HDL 40 11/05/2012   CHOLHDL 3.3 11/05/2012   VLDL 13 11/05/2012   LDLCALC 78 11/05/2012    Physical Findings: AIMS: Facial and Oral Movements Muscles of Facial Expression: None, normal Lips and Perioral Area: None, normal Jaw: None, normal Tongue: None, normal,Extremity Movements Upper (arms, wrists, hands, fingers): None, normal Lower (legs, knees, ankles, toes): None, normal, Trunk Movements Neck, shoulders, hips: None, normal, Overall Severity Severity of abnormal movements (highest score from questions above): None, normal Incapacitation due to abnormal movements: None, normal, Dental Status Current problems with teeth and/or dentures?: No Does patient usually wear dentures?: No  CIWA:  CIWA-Ar Total: 1 COWS:  COWS Total Score: 2  Musculoskeletal: Strength & Muscle Tone: within normal limits Gait & Station: normal Patient leans: N/A  Psychiatric Specialty Exam: Physical Exam  Nursing note and vitals reviewed. Constitutional: She is oriented to person, place, and time. She appears well-developed and well-nourished.  HENT:  Head: Normocephalic and atraumatic.  Neurological: She is alert and oriented to person, place, and time.    ROS  Blood pressure 101/72, pulse 86, temperature 98 F (36.7 C), temperature source Oral, resp. rate 20, height 5\' 7"  (1.702 m), weight 98 kg, last menstrual period 11/28/2018, SpO2 100 %.Body mass index is 33.83 kg/m.  General Appearance: Casual  Eye Contact:  Fair  Speech:  Normal Rate  Volume:  Normal  Mood:  Anxious and Irritable  Affect:  Congruent  Thought Process:  Coherent and Descriptions of Associations: Circumstantial  Orientation:  Full (Time, Place, and  Person)  Thought Content:  Logical  Suicidal Thoughts:  No  Homicidal Thoughts:  No  Memory:  Immediate;   Fair Recent;   Fair Remote;   Fair  Judgement:  Intact  Insight:  Fair  Psychomotor Activity:  Increased  Concentration:  Concentration: Fair and Attention Span: Fair  Recall:  [employer]Fair  Fund of Knowledge:  Fair  Language:  Fair  Akathisia:  Negative  Handed:  Right  AIMS (if indicated):     Assets:  Desire for Improvement Resilience  ADL's:  Intact  Cognition:  WNL  Sleep:  Number of Hours: 6.75     Treatment Plan Summary: Daily contact with patient to assess and evaluate symptoms and progress in treatment, Medication management and Plan : Patient is seen and examined.  Patient is a 18 year old female with the above-stated past psychiatric history who is seen in follow-up.   Diagnosis: #1 major depression, recurrent, severe without psychotic features, #2 cannabis use disorder, #3 alcohol use disorder, #4 attention deficit hyperactivity disorder  Patient is seen in follow-up.  She seems to be slowly improving.  No change in her current medications.  If her symptoms remain improving and her suicidal ideation resolves we will consider discharge in 1 to 2 days. 1.  Continue Strattera 18 mg p.o. daily for attention deficit hyperactivity disorder. 2.  Continue hydroxyzine 25 mg p.o. 3 times daily as needed anxiety. 3.  Continue Zoloft 50 mg p.o. daily for anxiety and depression. 4.  Continue thiamine 100 mg p.o. daily for nutritional supplementation. 5.  Continue trazodone 50 mg p.o. nightly as needed insomnia. 6.  Disposition planning-in progress.  Antonieta PertGreg Lawson Tyshun Tuckerman, MD 02/01/2019, 2:39 PM

## 2019-02-01 NOTE — Plan of Care (Signed)
Nurse discussed anxiety, depression, coping skills with patient. 

## 2019-02-01 NOTE — Progress Notes (Signed)
The focus of this group is to help patients establish daily goals to achieve during treatment and discuss how the patient can incorporate goal setting into their daily lives to aide in recovery. 

## 2019-02-01 NOTE — Progress Notes (Signed)
Spiritual care group on grief and loss facilitated by chaplain Brenyn Petrey  Group Goal:  Support / Education around grief and loss Members engage in facilitated group support and psycho social education.  Group Description:  Following introductions and group rules,  Group members engaged in facilitated group dialog and support around topic of loss, with particular support around experiences of loss in their lives. Group Identified types of loss (relationships / self / things) and identified patterns, circumstances, and changes that precipitate losses. Reflected on thoughts / feelings around loss, normalized grief responses, and recognized variety in grief experience. Patient Progress:  Pt invited to group.  Did not attend  

## 2019-02-02 NOTE — [facility] Group Notes (Signed)
Occupational Therapy Group Note  Date:  02/02/2019 Time:  11:33 AM  Group Topic/Focus:  Self Esteem Action Plan:   The focus of this group is to help patients create a plan to continue to build self-esteem after discharge.  Participation Level:  Active  Participation Quality:  Appropriate  Affect:  Blunted  Cognitive:  Appropriate  Insight: Improving  Engagement in Group:  Engaged  Modes of Intervention:  Activity, Discussion, Education and Socialization  Additional Comments:    S: "I have really bad self esteem right now"  O: OT tx with focus on self esteem building this date. Education given on definition of self esteem, with both causes of low and high self esteem identified. Activity given for pt to identify a positive/aspiring trait for each letter of the alphabet. Pt to work with peers to help complete activity and build positive thinking.   A: Pt presents with blunted affect, engaged and participatory throughout session. She shares how her self esteem is currently negative. She shares that negative experiences and people can decrease her self esteem, while engaging in self care and fun activity can increase her positive self esteem. Pt completed A-Z activity 75%, not wanting to share with group at this time.  P: OT group will be x1 per week while pt inpatient.  Zenovia Jarred, MSOT, OTR/L Behavioral Health OT/ Acute Relief OT PHP Office: [place] 02/02/2019, 11:33 AM

## 2019-02-02 NOTE — Plan of Care (Signed)
Progress note  D: pt found in the hallway interacting with peers; compliant with medication administration. Pt denies any physical pain or symptoms. Pt is visibly anxious and animated. Pt was distracted with other pt's during assessment. Pt denies si/hi/ah/vh and verbally agrees to approach staff if these become apparent or before harming herself/others while at [facility].  A: pt provided support and encouragement. Pt given medication per protocol and standing orders. Q56m safety checks implemented and continued.  R: pt safe on the unit. Will continue to monitor.   Pt progressing in the following metrics  Problem: Education: Goal: Utilization of techniques to improve thought processes will improve Outcome: Progressing Goal: Knowledge of the prescribed therapeutic regimen will improve Outcome: Progressing   Problem: Activity: Goal: Interest or engagement in leisure activities will improve Outcome: Progressing Goal: Imbalance in normal sleep/wake cycle will improve Outcome: Progressing

## 2019-02-02 NOTE — Plan of Care (Signed)
D: Patient is alert, oriented, pleasant, and cooperative. Denies SI, HI, AVH, and verbally contracts for safety. Patient denies physical symptoms/pain.    A: Medications administered per MD order. Support provided. Patient educated on safety on the unit and medications. Routine safety checks every 15 minutes. Patient stated understanding to tell nurse about any new physical symptoms. Patient understands to tell staff of any needs.     R: No adverse drug reactions noted. Patient verbally contracts for safety. Patient remains safe at this time and will continue to monitor.   Problem: Education: Goal: Mental status will improve Outcome: Progressing   Problem: Safety: Goal: Periods of time without injury will increase Outcome: Progressing  Patient denies SI, HI, AVH, and contracts for safety. Patient remains safe and will continue to monitor.   [place] NOVEL CORONAVIRUS (COVID-19) DAILY CHECK-OFF SYMPTOMS - answer yes or no to each - every day NO YES  Have you had a fever in the past 24 hours?  Fever (Temp > 37.80C / 100F) X   Have you had any of these symptoms in the past 24 hours? New Cough  Sore Throat   Shortness of Breath  Difficulty Breathing  Unexplained Body Aches   X   Have you had any one of these symptoms in the past 24 hours not related to allergies?   Runny Nose  Nasal Congestion  Sneezing   X   If you have had runny nose, nasal congestion, sneezing in the past 24 hours, has it worsened?  X   EXPOSURES - check yes or no X   Have you traveled outside the state in the past 14 days?  X   Have you been in contact with someone with a confirmed diagnosis of COVID-19 or PUI in the past 14 days without wearing appropriate PPE?  X   Have you been living in the same home as a person with confirmed diagnosis of COVID-19 or a PUI (household contact)?    X   Have you been diagnosed with COVID-19?    X              What to do next: Answered NO to all: Answered YES to  anything:   Proceed with unit schedule Follow the [facility] Inpatient Flowsheet.      

## 2019-02-02 NOTE — Progress Notes (Signed)
[facility] MD Progress Note  02/02/2019 11:10 AM Paige Wright  MRN:  [id number] Subjective:  Patient is a 18 year old female with a past psychiatric history significant for depression, attention deficit hyperactivity disorder, alcohol use disorder as well as cannabis use disorder.  She was admitted on 01/27/2025 secondary to suicidal ideation and thoughts of overdosing on an over-the-counter medication.  Objective: Patient is seen and examined.  Patient is a 18 year old female with the above-stated past psychiatric history who is seen in follow-up.  She continues to slowly improve.  Her mood and affect are much better today.  She stated that the Zoloft is doing very well, and denied any side effects of it.  She stated her sleep was good, and her irritability was decreased.  She stated that her plan was that after discharge she would go back and stay with her grandmother.  She stated she had hoped that with her anxiety and depression improved that her irritability with her would be decreased as well.  Her vital signs are stable, she is afebrile.  She slept 6 hours last night.  Principal Problem: Severe recurrent major depression without psychotic features ([place]) Diagnosis: Principal Problem:   Severe recurrent major depression without psychotic features ([place])  Total Time spent with patient: 15 minutes  Past Psychiatric History: See admission H&P  Past Medical History:  Past Medical History:  Diagnosis Date  . ADHD (attention deficit hyperactivity disorder)   . Anxiety   . Bacterial vaginosis 09/06/2014  . Contraceptive education 11/01/2013  . Hallucination   . Hypertension   . Ingrown left greater toenail 05/20/2013  . Lives in group home 10/04/2015  . Schizophrenia ([place])    History reviewed. No pertinent surgical history. Family History:  Family History  Problem Relation Age of Onset  . HIV/AIDS Mother   . Mental illness Mother   . Hypertension Mother   . Kidney disease Mother   . Mental  illness Father   . HIV/AIDS Father   . Asthma Sister   . Diabetes Maternal Grandmother   . Hypertension Maternal Grandmother   . Hypertension Maternal Grandfather   . Mental illness Paternal Grandmother   . Mental illness Paternal Grandfather   . Mental illness Maternal Aunt   . Hypertension Maternal Aunt   . Diabetes Maternal Aunt   . Cancer Maternal Aunt   . Mental illness Paternal Aunt   . Mental illness Paternal Uncle    Family Psychiatric  History: See admission H&P Social History:  Social History   Substance and Sexual Activity  Alcohol Use Yes   Comment: occ     Social History   Substance and Sexual Activity  Drug Use Yes  . Types: Marijuana    Social History   Socioeconomic History  . Marital status: Single    Spouse name: Not on file  . Number of children: Not on file  . Years of education: Not on file  . Highest education level: Not on file  Occupational History  . Not on file  Social Needs  . Financial resource strain: Not on file  . Food insecurity    Worry: Not on file    Inability: Not on file  . Transportation needs    Medical: Not on file    Non-medical: Not on file  Tobacco Use  . Smoking status: Former Smoker    Types: Cigarettes  . Smokeless tobacco: Never Used  Substance and Sexual Activity  . Alcohol use: Yes    Comment: occ  .  Drug use: Yes    Types: Marijuana  . Sexual activity: Yes    Birth control/protection: None  Lifestyle  . Physical activity    Days per week: Not on file    Minutes per session: Not on file  . Stress: Not on file  Relationships  . Social Musicianconnections    Talks on phone: Not on file    Gets together: Not on file    Attends religious service: Not on file    Active member of club or organization: Not on file    Attends meetings of clubs or organizations: Not on file    Relationship status: Not on file  Other Topics Concern  . Not on file  Social History Narrative   Lives with GM and cousins,  And sister    No smokesrs   Formerly in [facility] 2016-2018   Additional Social History:                         Sleep: Good  Appetite:  Good  Current Medications: Current Facility-Administered Medications  Medication Dose Route Frequency Provider Last Rate Last Dose  . acetaminophen (TYLENOL) tablet 650 mg  650 mg Oral Q6H PRN Nira ConnBerry, Jason A, NP      . alum & mag hydroxide-simeth (MAALOX/MYLANTA) 200-200-20 MG/5ML suspension 30 mL  30 mL Oral Q4H PRN Nira ConnBerry, Jason A, NP      . atomoxetine (STRATTERA) capsule 18 mg  18 mg Oral Daily Cobos, Rockey SituFernando A, MD   18 mg at 02/02/19 0805  . hydrOXYzine (ATARAX/VISTARIL) tablet 25 mg  25 mg Oral TID PRN Nira ConnBerry, Jason A, NP      . magnesium hydroxide (MILK OF MAGNESIA) suspension 30 mL  30 mL Oral Daily PRN Nira ConnBerry, Jason A, NP   30 mL at 01/30/19 0820  . multivitamin with minerals tablet 1 tablet  1 tablet Oral Daily Cobos, Rockey SituFernando A, MD   1 tablet at 02/02/19 0804  . nicotine polacrilex (NICORETTE) gum 2 mg  2 mg Oral PRN Cobos, Rockey SituFernando A, MD   2 mg at 02/01/19 1820  . sertraline (ZOLOFT) tablet 50 mg  50 mg Oral Daily Oneta RackLewis, Tanika N, NP   50 mg at 02/02/19 0804  . thiamine (B-1) injection 100 mg  100 mg Intramuscular Once Cobos, Fernando A, MD      . thiamine (VITAMIN B-1) tablet 100 mg  100 mg Oral Daily Cobos, Rockey SituFernando A, MD   100 mg at 02/02/19 0805  . traZODone (DESYREL) tablet 50 mg  50 mg Oral QHS PRN Cobos, Rockey SituFernando A, MD   50 mg at 02/01/19 2114    Lab Results: No results found for this or any previous visit (from the past 48 hour(s)).  Blood Alcohol level:  Lab Results  Component Value Date   [facility]   ETH <11 04/12/2014    Metabolic Disorder Labs: Lab Results  Component Value Date   HGBA1C 5.9 (H) 11/05/2012   MPG 123 (H) 11/05/2012   Lab Results  Component Value Date   PROLACTIN 35.1 11/05/2012   Lab Results  Component Value Date   CHOL 131 11/05/2012   TRIG 63 11/05/2012   HDL 40 11/05/2012   CHOLHDL  3.3 11/05/2012   VLDL 13 11/05/2012   LDLCALC 78 11/05/2012    Physical Findings: AIMS: Facial and Oral Movements Muscles of Facial Expression: None, normal Lips and Perioral Area: None, normal Jaw: None, normal Tongue: None, normal,Extremity  Movements Upper (arms, wrists, hands, fingers): None, normal Lower (legs, knees, ankles, toes): None, normal, Trunk Movements Neck, shoulders, hips: None, normal, Overall Severity Severity of abnormal movements (highest score from questions above): None, normal Incapacitation due to abnormal movements: None, normal Patient's awareness of abnormal movements (rate only patient's report): No Awareness, Dental Status Current problems with teeth and/or dentures?: No Does patient usually wear dentures?: No  CIWA:  CIWA-Ar Total: 1 COWS:  COWS Total Score: 2  Musculoskeletal: Strength & Muscle Tone: within normal limits Gait & Station: normal Patient leans: N/A  Psychiatric Specialty Exam: Physical Exam  Nursing note and vitals reviewed. Constitutional: She is oriented to person, place, and time. She appears well-developed and well-nourished.  HENT:  Head: Normocephalic and atraumatic.  Respiratory: Effort normal.  Neurological: She is alert and oriented to person, place, and time.    ROS  Blood pressure 111/72, pulse 91, temperature 98 F (36.7 C), temperature source Oral, resp. rate 20, height 5\' 7"  (1.702 m), weight 98 kg, last menstrual period 11/28/2018, SpO2 100 %.Body mass index is 33.83 kg/m.  General Appearance: Casual  Eye Contact:  Good  Speech:  Normal Rate  Volume:  Normal  Mood:  Euthymic  Affect:  Congruent  Thought Process:  Coherent and Descriptions of Associations: Intact  Orientation:  Full (Time, Place, and Person)  Thought Content:  Logical  Suicidal Thoughts:  No  Homicidal Thoughts:  No  Memory:  Immediate;   Fair Recent;   Fair Remote;   Fair  Judgement:  Intact  Insight:  Fair  Psychomotor Activity:   Normal  Concentration:  Concentration: Fair and Attention Span: Fair  Recall:  FiservFair  Fund of Knowledge:  Fair  Language:  Good  Akathisia:  Negative  Handed:  Right  AIMS (if indicated):     Assets:  Desire for Improvement Physical Health  ADL's:  Intact  Cognition:  WNL  Sleep:  Number of Hours: 6     Treatment Plan Summary: Daily contact with patient to assess and evaluate symptoms and progress in treatment, Medication management and Plan : Patient is seen and examined.  Patient is a 18 year old female with the above-stated past psychiatric history who is seen in follow-up.  Diagnosis: #1 major depression, recurrent, severe without psychotic features, #2 cannabis use disorder, #3 alcohol use disorder, #4 attention deficit hyperactivity disorder  Patient continues to improve.  No change in her medications.  If her symptoms continue to improve we will plan discharge tomorrow. 1.  Continue Strattera 18 mg p.o. daily for attention deficit hyperactivity disorder. 2.  Continue hydroxyzine 25 mg p.o. 3 times daily as needed anxiety. 3.  Continue Zoloft 50 mg p.o. daily for anxiety and depression. 4.  Continue thiamine 100 mg p.o. daily for nutritional supplementation. 5.  Continue trazodone 50 mg p.o. nightly as needed insomnia. 6.  Disposition planning-in progress. Antonieta PertGreg Lawson Sanae Willetts, MD 02/02/2019, 11:10 AM

## 2019-02-03 MED ORDER — TRAZODONE HCL 50 MG PO TABS: 50.0000 mg | ORAL_TABLET | Freq: Every evening | ORAL | 0 refills | Status: DC | PRN

## 2019-02-03 MED ORDER — NICOTINE POLACRILEX 2 MG MT GUM: 2.0000 mg | CHEWING_GUM | OROMUCOSAL | 0 refills | Status: DC | PRN

## 2019-02-03 MED ORDER — SERTRALINE HCL 50 MG PO TABS: 50.0000 mg | ORAL_TABLET | Freq: Every day | ORAL | 0 refills | Status: DC

## 2019-02-03 NOTE — [facility] Group Notes (Signed)
Adult Psychoeducational Group Note  Date:  02/03/2019 Time:  11:23 AM  Group Topic/Focus:  Goals Group:   The focus of this group is to help patients establish daily goals to achieve during treatment and discuss how the patient can incorporate goal setting into their daily lives to aide in recovery.  Participation Level:  Active  Participation Quality:  Appropriate  Affect:  Appropriate  Cognitive:  Appropriate  Insight: Appropriate  Engagement in Group:  Engaged  Modes of Intervention:  Orientation  Additional Comments:  Pt goal for today is to get discharged  Huel Cote 02/03/2019, 11:23 AM

## 2019-02-03 NOTE — Progress Notes (Signed)
Patient's self inventory sheet, patient sleeps good, sleep medication helpful.  Good appetite, high energy level, good concentration.  Rated depression, hopeless and anxiety 10.  Denied SI.  Denied physical problems.  Denied phyhsical pain.  Goal is discharge.  Plans to keep goal in her head.  Does have discharge plans.

## 2019-02-03 NOTE — Progress Notes (Addendum)
Discharge Note:  Patient discharged.  Patient denied SI and HI.  Denied A/V hallucinations.  Patient stated she received all her belongings, clothing, toiletries, misc items.  All required discharge information given to patient at discharge by another nurse. Marland Kitchen

## 2019-02-03 NOTE — [facility] Suicide Risk Assessment (Signed)
[facility] Discharge Suicide Risk Assessment   Principal Problem: Severe recurrent major depression without psychotic features [facility]) Discharge Diagnoses: Principal Problem:   Severe recurrent major depression without psychotic features ([place])   Total Time spent with patient: 15 minutes  Musculoskeletal: Strength & Muscle Tone: within normal limits Gait & Station: normal Patient leans: N/A  Psychiatric Specialty Exam: Review of Systems  All other systems reviewed and are negative.   Blood pressure 124/90, pulse 74, temperature 97.9 F (36.6 C), temperature source Oral, resp. rate 18, height 5\' 7"  (1.702 m), weight 98 kg, last menstrual period 11/28/2018, SpO2 100 %.Body mass index is 33.83 kg/m.  General Appearance: Casual  Eye Contact::  Good  Speech:  Normal Rate409  Volume:  Normal  Mood:  Euthymic  Affect:  Congruent  Thought Process:  Coherent and Descriptions of Associations: Intact  Orientation:  Full (Time, Place, and Person)  Thought Content:  Logical  Suicidal Thoughts:  No  Homicidal Thoughts:  No  Memory:  Immediate;   Fair Recent;   Fair Remote;   Fair  Judgement:  Intact  Insight:  Fair  Psychomotor Activity:  Normal  Concentration:  Good  Recall:  Good  Fund of Knowledge:Good  Language: Good  Akathisia:  Negative  Handed:  Right  AIMS (if indicated):     Assets:  Desire for Improvement Housing Resilience  Sleep:  Number of Hours: 6.75  Cognition: WNL  ADL's:  Intact   Mental Status Per Nursing Assessment::   On Admission:  Suicidal ideation indicated by patient, Suicide plan  Demographic Factors:  Adolescent or young adult and Low socioeconomic status  Loss Factors: NA  Historical Factors: Impulsivity  Risk Reduction Factors:   Living with another person, especially a relative  Continued Clinical Symptoms:  Depression:   Impulsivity  Cognitive Features That Contribute To Risk:  None    Suicide Risk:  Minimal: No identifiable suicidal  ideation.  Patients presenting with no risk factors but with morbid ruminations; may be classified as minimal risk based on the severity of the depressive symptoms  Follow-up Information    [facility] Follow up on 02/04/2019.   Why: Telephonic hospital discharge appoitnment is Friday, 6/19 at 11:00a.  The provider will contact you the day of the appointment.  Contact information: [address] [place] [address] [phone]           Plan Of Care/Follow-up recommendations:  Activity:  ad lib  Sharma Covert, MD 02/03/2019, 8:04 AM
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
## 2019-06-23 ENCOUNTER — Other Ambulatory Visit: Payer: Self-pay

## 2019-06-23 ENCOUNTER — Ambulatory Visit (INDEPENDENT_AMBULATORY_CARE_PROVIDER_SITE_OTHER): Payer: Medicaid Other | Admitting: *Deleted

## 2019-06-23 VITALS — BP 129/79 | HR 80

## 2019-06-23 DIAGNOSIS — Z3202 Encounter for pregnancy test, result negative: Secondary | ICD-10-CM | POA: Diagnosis not present

## 2019-06-23 DIAGNOSIS — N926 Irregular menstruation, unspecified: Secondary | ICD-10-CM

## 2019-06-23 LAB — POCT URINE PREGNANCY: Preg Test, Ur: NEGATIVE

## 2019-06-23 NOTE — Progress Notes (Signed)
   NURSE VISIT- PREGNANCY CONFIRMATION   SUBJECTIVE:  Paige Wright is a 18 y.o. [place] female.Here for pregnancy confirmation.  Home pregnancy test: positive x 1  She reports nausea.  She is not taking prenatal vitamins.    OBJECTIVE:  BP 129/79 (BP Location: Right Arm, Patient Position: Sitting, Cuff Size: Normal)   Pulse 80   LMP  (LMP Unknown) Comment: has irregular periods. thinks she had one last month  Breastfeeding Unknown   Appears well, in no apparent distress OB History  Gravida Para Term Preterm AB Living  1 0 0 0 0 0  SAB TAB Ectopic Multiple Live Births  0 0 0 0 0    # Outcome Date GA Lbr Len/2nd Weight Sex Delivery Anes PTL Lv  1 Gravida             Results for orders placed or performed in visit on 06/23/19 (from the past 24 hour(s))  POCT urine pregnancy   Collection Time: 06/23/19  8:50 AM  Result Value Ref Range   Preg Test, Ur Negative Negative    ASSESSMENT: Negative pregnancy test    PLAN: Beta HCG today Prenatal vitamins: plans to begin OTC ASAP   Nausea medicines: not currently needed   OB packet given: No  Rash, Celene Squibb  06/23/2019 8:51 AM

## 2019-06-23 NOTE — Progress Notes (Signed)
Chart reviewed for nurse visit. Agree with plan of care.  Estill Dooms, NP 06/23/2019 9:34 AM

## 2019-06-24 ENCOUNTER — Telehealth: Payer: Self-pay | Admitting: *Deleted

## 2019-06-24 LAB — BETA HCG QUANT (REF LAB): hCG Quant: <1 m[IU]/mL

## 2019-06-24 NOTE — Telephone Encounter (Signed)
Patient left message requesting blood test results. I returned her call and informed her that hcg is negative. Patient voiced understanding and had no other questions at this time.

## 2019-07-28 ENCOUNTER — Other Ambulatory Visit: Payer: Medicaid Other

## 2019-08-07 ENCOUNTER — Other Ambulatory Visit: Payer: Self-pay

## 2019-08-07 ENCOUNTER — Encounter (HOSPITAL_COMMUNITY): Payer: Self-pay

## 2019-08-07 ENCOUNTER — Emergency Department (HOSPITAL_COMMUNITY)
Admission: EM | Admit: 2019-08-07 | Discharge: 2019-08-07 | Disposition: A | Discharge status: Home / Self Care | Payer: Medicaid Other | Attending: Emergency Medicine | Admitting: Emergency Medicine

## 2019-08-07 DIAGNOSIS — R102 Pelvic and perineal pain: Secondary | ICD-10-CM | POA: Diagnosis present

## 2019-08-07 DIAGNOSIS — N898 Other specified noninflammatory disorders of vagina: Secondary | ICD-10-CM | POA: Insufficient documentation

## 2019-08-07 DIAGNOSIS — Z87891 Personal history of nicotine dependence: Secondary | ICD-10-CM | POA: Insufficient documentation

## 2019-08-07 DIAGNOSIS — I10 Essential (primary) hypertension: Secondary | ICD-10-CM | POA: Insufficient documentation

## 2019-08-07 LAB — URINALYSIS, ROUTINE W REFLEX MICROSCOPIC
Bilirubin Urine: NEGATIVE
Glucose, UA: NEGATIVE mg/dL
Hgb urine dipstick: NEGATIVE
Ketones, ur: NEGATIVE mg/dL
Leukocytes,Ua: NEGATIVE
Nitrite: NEGATIVE
Protein, ur: NEGATIVE mg/dL
Specific Gravity, Urine: 1.024 (ref 1.005–1.030)
pH: 6 (ref 5.0–8.0)

## 2019-08-07 LAB — PREGNANCY, URINE: Preg Test, Ur: NEGATIVE

## 2019-08-07 NOTE — ED Triage Notes (Signed)
Pt reports pelvic pain x 2 weeks.  Report vomiting this morning.  Denies diarrhea.  LBM was yesterday.  Denies urinary symptoms but reports a white foul smelling vaginal discharge x 2 weeks.

## 2019-08-07 NOTE — ED Notes (Signed)
PT walked out of room. I asked her can I help her and she said she had to leave and go to work. PT signed out [place] form. PT educated on risks of leaving without waiting on disposition from the doctor.

## 2019-08-07 NOTE — ED Provider Notes (Signed)
[facility] EMERGENCY DEPARTMENT Provider Note   CSN: [id number] Arrival date & time: 08/07/19  1016     History Chief Complaint  Patient presents with  . Abdominal Pain    Paige Wright is a 18 y.o. female.  Patient with the complaint of pelvic pain for 2 weeks.  And a vaginal discharge for 2 weeks.  Reported vomiting this morning.  Denied any diarrhea.  Denies any urinary tract symptoms but reports white foul-smelling vaginal discharge for 2 weeks.        Past Medical History:  Diagnosis Date  . ADHD (attention deficit hyperactivity disorder)   . Anxiety   . Bacterial vaginosis 09/06/2014  . Contraceptive education 11/01/2013  . Hallucination   . Hypertension   . Ingrown left greater toenail 05/20/2013  . Lives in group home 01-Nov-2015  . Schizophrenia [facility])     Patient Active Problem List   Diagnosis Date Noted  . Severe recurrent major depression without psychotic features (HCC) 01/28/2019  . Encounter for Nexplanon removal April 15, 2017  . Missed periods 04/15/17  . Death of parent 11-01-15  . Schizoaffective disorder (HCC) 07/29/2015  . Screening examination for STD (sexually transmitted disease) 11/01/2013  . ADHD (attention deficit hyperactivity disorder), combined type 11/08/2012  . Major depressive disorder, recurrent episode, moderate (HCC) 11/05/2012  . Generalized anxiety disorder 11/05/2012    History reviewed. No pertinent surgical history.   OB History    Gravida  1   Para  0   Term  0   Preterm  0   AB  0   Living  0     SAB  0   TAB  0   Ectopic  0   Multiple  0   Live Births  0           Family History  Problem Relation Age of Onset  . HIV/AIDS Mother   . Mental illness Mother   . Hypertension Mother   . Kidney disease Mother   . Mental illness Father   . HIV/AIDS Father   . Asthma Sister   . Diabetes Maternal Grandmother   . Hypertension Maternal Grandmother   . Hypertension Maternal Grandfather   .  Mental illness Paternal Grandmother   . Mental illness Paternal Grandfather   . Mental illness Maternal Aunt   . Hypertension Maternal Aunt   . Diabetes Maternal Aunt   . Cancer Maternal Aunt   . Mental illness Paternal Aunt   . Mental illness Paternal Uncle     Social History   Tobacco Use  . Smoking status: Former Smoker    Types: Cigarettes  . Smokeless tobacco: Never Used  Substance Use Topics  . Alcohol use: Yes    Comment: occ  . Drug use: Not Currently    Types: Marijuana    Home Medications Prior to Admission medications   Not on File    Allergies    Patient has no known allergies.  Review of Systems   Review of Systems  Constitutional: Negative for chills and fever.  HENT: Negative for congestion, rhinorrhea and sore throat.   Eyes: Negative for visual disturbance.  Respiratory: Negative for cough and shortness of breath.   Cardiovascular: Negative for chest pain and leg swelling.  Gastrointestinal: Positive for vomiting. Negative for abdominal pain, diarrhea and nausea.  Genitourinary: Positive for pelvic pain and vaginal discharge. Negative for dysuria.  Musculoskeletal: Negative for back pain and neck pain.  Skin: Negative for rash.  Neurological: Negative  for dizziness, light-headedness and headaches.  Hematological: Does not bruise/bleed easily.  Psychiatric/Behavioral: Negative for confusion.    Physical Exam Updated Vital Signs BP 129/85 (BP Location: Left Arm)   Pulse (!) 101   Temp 98.6 F (37 C) (Oral)   Resp 20   Ht 1.702 m (5\' 7" )   Wt 97.9 kg   SpO2 97%   BMI 33.80 kg/m   Physical Exam Constitutional:      General: She is not in acute distress.    Appearance: She is well-developed.  Neurological:     Mental Status: She is alert.     ED Results / Procedures / Treatments   Labs (all labs ordered are listed, but only abnormal results are displayed) Labs Reviewed  URINALYSIS, ROUTINE W REFLEX MICROSCOPIC - Abnormal; Notable  for the following components:      Result Value   APPearance HAZY (*)    All other components within normal limits  PREGNANCY, URINE    EKG None  Radiology No results found.  Procedures Procedures (including critical care time)  Medications Ordered in ED Medications - No data to display  ED Course  I have reviewed the triage vital signs and the nursing notes.  Pertinent labs & imaging results that were available during my care of the patient were reviewed by me and considered in my medical decision making (see chart for details).    MDM Rules/Calculators/A&P                       Patient when she knew her pregnancy test was negative left AMA.  Patient left prior to having formal evaluation.  Due to her vaginal discharge and pelvic pain was concerned about PID.  Was planning on doing pelvic exam and STD checks.  Patient's urinalysis without any bacteria but did have many white blood cells is probably secondary to the vaginal discharge.    Final Clinical Impression(s) / ED Diagnoses Final diagnoses:  Vaginal discharge    Rx / DC Orders ED Discharge Orders    None       Fredia Sorrow, MD 08/07/19 1127

## 2019-08-23 ENCOUNTER — Other Ambulatory Visit: Payer: Self-pay

## 2019-08-23 ENCOUNTER — Emergency Department (HOSPITAL_COMMUNITY)
Admission: EM | Admit: 2019-08-23 | Discharge: 2019-08-23 | Discharge status: Absent without leave | Payer: Medicaid Other

## 2019-08-24 ENCOUNTER — Ambulatory Visit (INDEPENDENT_AMBULATORY_CARE_PROVIDER_SITE_OTHER): Payer: Medicaid Other | Admitting: Advanced Practice Midwife

## 2019-08-24 ENCOUNTER — Other Ambulatory Visit: Payer: Self-pay

## 2019-08-24 ENCOUNTER — Encounter: Payer: Self-pay | Admitting: Advanced Practice Midwife

## 2019-08-24 VITALS — BP 92/60 | HR 97 | Ht 67.0 in | Wt 228.5 lb

## 2019-08-24 DIAGNOSIS — B3731 Acute candidiasis of vulva and vagina: Secondary | ICD-10-CM | POA: Insufficient documentation

## 2019-08-24 DIAGNOSIS — B373 Candidiasis of vulva and vagina: Secondary | ICD-10-CM | POA: Diagnosis not present

## 2019-08-24 DIAGNOSIS — Z3202 Encounter for pregnancy test, result negative: Secondary | ICD-10-CM

## 2019-08-24 LAB — POCT WET PREP (WET MOUNT)
Clue Cells Wet Prep Whiff POC: NEGATIVE
Trichomonas Wet Prep HPF POC: ABSENT

## 2019-08-24 LAB — POCT URINE PREGNANCY: Preg Test, Ur: NEGATIVE

## 2019-08-24 MED ORDER — FLUCONAZOLE 150 MG PO TABS: 150.0000 mg | ORAL_TABLET | Freq: Once | ORAL | 0 refills | Status: AC

## 2019-08-24 NOTE — Patient Instructions (Signed)
Vaginal Yeast Infection, Adult  Vaginal yeast infection is a condition that causes vaginal discharge as well as soreness, swelling, and redness (inflammation) of the vagina. This is a common condition. Some women get this infection frequently. What are the causes? This condition is caused by a change in the normal balance of the yeast (candida) and bacteria that live in the vagina. This change causes an overgrowth of yeast, which causes the inflammation. What increases the risk? The condition is more likely to develop in women who:  Take antibiotic medicines.  Have diabetes.  Take birth control pills.  Are pregnant.  Douche often.  Have a weak body defense system (immune system).  Have been taking steroid medicines for a long time.  Frequently wear tight clothing. What are the signs or symptoms? Symptoms of this condition include:  White, thick, creamy vaginal discharge.  Swelling, itching, redness, and irritation of the vagina. The lips of the vagina (vulva) may be affected as well.  Pain or a burning feeling while urinating.  Pain during sex. How is this diagnosed? This condition is diagnosed based on:  Your medical history.  A physical exam.  A pelvic exam. Your health care provider will examine a sample of your vaginal discharge under a microscope. Your health care provider may send this sample for testing to confirm the diagnosis. How is this treated? This condition is treated with medicine. Medicines may be over-the-counter or prescription. You may be told to use one or more of the following:  Medicine that is taken by mouth (orally).  Medicine that is applied as a cream (topically).  Medicine that is inserted directly into the vagina (suppository). Follow these instructions at home:  Lifestyle  Do not have sex until your health care provider approves. Tell your sex partner that you have a yeast infection. That person should go to his or her health care  provider and ask if they should also be treated.  Do not wear tight clothes, such as pantyhose or tight pants.  Wear breathable cotton underwear. General instructions  Take or apply over-the-counter and prescription medicines only as told by your health care provider.  Eat more yogurt. This may help to keep your yeast infection from returning.  Do not use tampons until your health care provider approves.  Try taking a sitz bath to help with discomfort. This is a warm water bath that is taken while you are sitting down. The water should only come up to your hips and should cover your buttocks. Do this 3-4 times per day or as told by your health care provider.  Do not douche.  If you have diabetes, keep your blood sugar levels under control.  Keep all follow-up visits as told by your health care provider. This is important. Contact a health care provider if:  You have a fever.  Your symptoms go away and then return.  Your symptoms do not get better with treatment.  Your symptoms get worse.  You have new symptoms.  You develop blisters in or around your vagina.  You have blood coming from your vagina and it is not your menstrual period.  You develop pain in your abdomen. Summary  Vaginal yeast infection is a condition that causes discharge as well as soreness, swelling, and redness (inflammation) of the vagina.  This condition is treated with medicine. Medicines may be over-the-counter or prescription.  Take or apply over-the-counter and prescription medicines only as told by your health care provider.  Do not douche.   Do not have sex or use tampons until your health care provider approves.  Contact a health care provider if your symptoms do not get better with treatment or your symptoms go away and then return. This information is not intended to replace advice given to you by your health care provider. Make sure you discuss any questions you have with your health care  provider. Document Revised: 03/04/2019 Document Reviewed: 12/21/2017 Elsevier Patient Education  2020 [employer]  

## 2019-08-24 NOTE — Progress Notes (Signed)
   GYN VISIT Patient name: Paige Wright MRN [id number]  Date of birth: 04-17-2001 Chief Complaint:   possible yeast infection (+ stomach pain)  History of Present Illness:   Paige Wright is a 19 y.o. G0P0000 African American female being seen today for vag itching/irritation after using product called [employer]oni Pearls (a detox). She placed it vaginally on 1/4, took it out on 1/5, and has had irritation since and white vag d/c.   No LMP recorded. (Menstrual status: Irregular Periods). The current method of family planning is none.  Last pap <21yo Review of Systems:   Pertinent items are noted in HPI Denies fever/chills, dizziness, headaches, visual disturbances, fatigue, shortness of breath, chest pain, abdominal pain, vomiting, abnormal vaginal discharge/itching/odor/irritation, problems with periods, bowel movements, urination, or intercourse unless otherwise stated above.  Pertinent History Reviewed:  Reviewed past medical,surgical, social, obstetrical and family history.  Reviewed problem list, medications and allergies. Physical Assessment:   Vitals:   08/24/19 1513  BP: 92/60  Pulse: 97  Weight: 228 lb 8 oz (103.6 kg)  Height: 5\' 7"  (1.702 m)  Body mass index is 35.79 kg/m.       Physical Examination:   General appearance: alert, well appearing, and in no distress  Mental status: alert, oriented to person, place, and time  Skin: warm & dry   Cardiovascular: normal heart rate noted  Respiratory: normal respiratory effort, no distress  Abdomen: soft, non-tender   Pelvic: normal external genitalia, vulva, vagina, cervix, uterus and adnexa  Extremities: no edema   Chaperone:    Results for orders placed or performed in visit on 08/24/19 (from the past 24 hour(s))  POCT urine pregnancy   Collection Time: 08/24/19  3:22 PM  Result Value Ref Range   Preg Test, Ur Negative Negative  POCT Wet Prep 10/22/19 [facility])   Collection Time: 08/24/19  3:59 PM  Result  Value Ref Range   Source Wet Prep POC vag    WBC, Wet Prep HPF POC few    Bacteria Wet Prep HPF POC Few Few   BACTERIA WET PREP MORPHOLOGY POC     Clue Cells Wet Prep HPF POC None None   Clue Cells Wet Prep Whiff POC Negative Whiff    Yeast Wet Prep HPF POC Many (A) None   KOH Wet Prep POC     Trichomonas Wet Prep HPF POC Absent Absent    Assessment & Plan:  1) [facility]> rx Diflucan per pt request; rec to stop using any sort of vag detox product   Meds:  Meds ordered this encounter  Medications  . fluconazole (DIFLUCAN) 150 MG tablet    Sig: Take 1 tablet (150 mg total) by mouth once for 1 dose.    Dispense:  1 tablet    Refill:  0    Order Specific Question:   Supervising Provider    Answer:   10/22/19 [[id number]]    Orders Placed This Encounter  Procedures  . POCT urine pregnancy  . POCT Wet Prep [facility])    Return in about 6 months (around 02/21/2020) for Physical.  04/23/2020 CNM 08/24/2019 4:00 PM

## 2019-09-02 ENCOUNTER — Other Ambulatory Visit (HOSPITAL_COMMUNITY)
Admission: RE | Admit: 2019-09-02 | Discharge: 2019-09-02 | Disposition: A | Discharge status: Home / Self Care | Payer: Medicaid Other | Source: Ambulatory Visit | Attending: Obstetrics & Gynecology | Admitting: Obstetrics & Gynecology

## 2019-09-02 ENCOUNTER — Other Ambulatory Visit: Payer: Self-pay

## 2019-09-02 ENCOUNTER — Other Ambulatory Visit (INDEPENDENT_AMBULATORY_CARE_PROVIDER_SITE_OTHER): Payer: Medicaid Other | Admitting: *Deleted

## 2019-09-02 DIAGNOSIS — Z113 Encounter for screening for infections with a predominantly sexual mode of transmission: Secondary | ICD-10-CM | POA: Insufficient documentation

## 2019-09-02 NOTE — Progress Notes (Signed)
   NURSE VISIT- STD  SUBJECTIVE:  Paige Wright is a 19 y.o. G0P0000 GYN patientfemale here for a vaginal swab for STD screen.  She reports the following symptoms: none. Denies abnormal vaginal bleeding, significant pelvic pain, fever, or UTI symptoms.  OBJECTIVE:  There were no vitals taken for this visit.  Appears well, in no apparent distress  ASSESSMENT: Vaginal swab for STD screen  PLAN: Self-collected vaginal probe for Gonorrhea, Chlamydia, Trichomonas sent to lab Treatment: to be determined once results are received Follow-up as needed if symptoms persist/worsen, or new symptoms develop  Malachy Mood  09/02/2019 10:51 AM

## 2019-09-02 NOTE — Progress Notes (Signed)
Chart reviewed for nurse visit. Agree with plan of care.  Adline Potter, NP 09/02/2019 12:08 PM

## 2019-09-03 LAB — RPR: RPR Ser Ql: NONREACTIVE

## 2019-09-03 LAB — HIV ANTIBODY (ROUTINE TESTING W REFLEX): HIV Screen 4th Generation wRfx: NONREACTIVE

## 2019-09-05 LAB — CERVICOVAGINAL ANCILLARY ONLY
Chlamydia: NEGATIVE
Comment: NEGATIVE
Comment: NEGATIVE
Comment: NORMAL
Neisseria Gonorrhea: NEGATIVE
Trichomonas: NEGATIVE

## 2019-09-16 ENCOUNTER — Ambulatory Visit (INDEPENDENT_AMBULATORY_CARE_PROVIDER_SITE_OTHER): Payer: Medicaid Other | Admitting: Pediatrics

## 2019-09-16 ENCOUNTER — Other Ambulatory Visit: Payer: Self-pay

## 2019-09-16 ENCOUNTER — Encounter: Payer: Self-pay | Admitting: Pediatrics

## 2019-09-16 VITALS — BP 120/78 | Ht 66.5 in | Wt 224.2 lb

## 2019-09-16 DIAGNOSIS — M6740 Ganglion, unspecified site: Secondary | ICD-10-CM

## 2019-09-16 DIAGNOSIS — Z68.41 Body mass index (BMI) pediatric, greater than or equal to 95th percentile for age: Secondary | ICD-10-CM | POA: Diagnosis not present

## 2019-09-16 DIAGNOSIS — Z0001 Encounter for general adult medical examination with abnormal findings: Secondary | ICD-10-CM | POA: Diagnosis not present

## 2019-09-16 NOTE — Patient Instructions (Signed)
Well Child Nutrition, Young Adult This sheet provides general nutrition recommendations. Talk with a health care provider or a diet and nutrition specialist (dietitian) if you have any questions. Nutrition  The amount of food you need to eat every day depends on your age, sex, size, and activity level. To figure out your daily calorie needs, look for a calorie calculator online or talk with your health care provider. Balanced diet Eat a balanced diet. Try to include:  Fruits. Aim for 2 cups a day. Examples of 1 cup of fruit include 1 large banana, 1 small apple, 8 large strawberries, or 1 large orange. Eat a variety of whole fruits and 100% fruit juice. Choose fresh, canned, frozen, or dried forms. Choose canned fruit that has the lowest added sugar or no added sugar.  Vegetables. Aim for 2-3 cups a day. Examples of 1 cup of vegetables include 2 medium carrots, 1 large tomato, or 2 stalks of celery. Choose fresh, frozen, canned, and dried options. Eat vegetables of a variety of colors.  Low-fat dairy. Aim for 3 cups a day. Examples of 1 cup of dairy include 8 oz (230 mL) of milk, 8 oz (230 g) of yogurt, or 1 oz (44 g) of natural cheese. Choose fat-free or low-fat dairy products, including milk, yogurt, and cheese. If you are unable to tolerate dairy (lactose intolerant) or you choose not to consume dairy, you may include fortified soy beverages (soy milk).  Whole grains. Of the grain foods that you eat each day (such as pasta, rice, and tortillas), aim to include 6-8 "ounce-equivalents" of whole-grain options. Examples of 1 ounce-equivalent of whole grains include 1 cup of whole-wheat cereal,  cup of brown rice, or 1 slice of whole-wheat bread. Try to choose whole grains including brown rice, wild rice, quinoa, and oats.  Lean proteins. Aim for 5-6 "ounce-equivalents" a day. Eat a variety of protein foods, including lean meats, seafood, poultry, eggs, legumes (beans and peas), nuts, seeds, and  soy products. ? A cut of meat or fish that is the size of a deck of cards is about 3-4 ounce-equivalents. ? Foods that provide 1 ounce-equivalent of protein include 1 egg,  cup of nuts or seeds, or 1 tablespoon (16 g) of peanut butter. For more information and options for foods in a balanced diet, visit www.Build[employer].es Tips for healthy snacking  A snack should not be the size of a full meal. Eat snacks that have 200 calories or less. Examples include: ?  whole-wheat pita with  cup hummus. ? 2 or 3 slices of deli [place] wrapped around a cheese stick. ?  apple with 1 tablespoon of peanut butter. ? 10 baked chips with salsa.  Keep cut-up fruits and vegetables available at home and at school so they are easy to eat.  Pack healthy snacks the night before or when you pack your lunch.  Avoid pre-packaged foods. These tend to be higher in fat, sugar, and salt (sodium).  Get involved with shopping, or ask the primary food shopper in your household to get healthy snacks that you like.  Avoid chips, candy, cake, and soft drinks. Foods to avoid  Maceo Pro or heavily processed foods, such as toaster pastries and microwaveable dinners.  Drinks that contain a lot of sugar, such as sports drinks, sodas, and juice.  Foods that contain a lot of fat, sodium, or sugar. Food safety Prepare your food safely:  Wash your hands after handling raw meats.  Keep food preparation surfaces clean by  washing them regularly with hot, soapy water.  Keep raw meats separate from foods that are ready-to-eat, such as fruits and vegetables.  Cook seafood, meat, poultry, and eggs to the recommended minimum safe internal temperature.  Store foods at safe temperatures. In general: ? Keep cold foods at 40F (4C) or colder. ? Keep your freezer at 0F (-18C or 18 degrees below 0C) or colder. ? Keep hot foods at 140F (60C) or warmer. ? Foods are no longer safe to eat when they have been at a temperature of  40-140F (4-60C) for more than 2 hours. Physical activity  Try to get 150 minutes of moderate-intensity physical activity each week. Examples include walking briskly or bicycling slower than 10 miles an hour (16 km an hour).  Do muscle-strengthening exercises on 2 or more days a week.  If you find it difficult to fit regular physical activity into your schedule, try: ? Taking the stairs instead of the elevator. ? Parking your car farther from the entrance or at the back of the parking lot. ? Biking or walking to work or school.  If you need to lose weight, you may need to reduce your daily calorie intake and increase your daily amount of physical activity. Check with your health care provider before you start a new diet and exercise plan. General instructions  Do not skip meals, especially breakfast.  Water is the ideal beverage. Aim to drink six 8-oz glasses of water each day.  Avoid fad diets. These may affect your mood and growth.  If you choose to consume alcohol: ? Drink in moderation. This means two drinks a day for men and one drink a day for nonpregnant women. One drink equals 12 oz of beer, 5 oz of wine, or 1 oz of hard liquor.  You may drink coffee. It is recommended that you limit coffee intake to three to five 8-oz cups a day (up to 400 mg of caffeine).  If you are worried about your body image, talk with your parents, your health care provider, or another trusted adult like a coach or counselor. You may be at risk for developing an eating disorder. Eating disorders can lead to serious medical problems.  Food allergies may cause you to have a reaction (such as a rash, diarrhea, or vomiting) after eating or drinking. Talk with your health care provider if you have concerns about food allergies. Summary  Eat a balanced diet. Include fruits, vegetables, low-fat dairy, whole grains, and lean proteins.  Try to get 150 minutes of moderate-intensity physical activity each  week, and do muscle-strengthening exercises on 2 or more days a week.  Choose healthy snacks that are 200 calories or less.  Drink plenty of water. Try to drink six 8-oz glasses a day. This information is not intended to replace advice given to you by your health care provider. Make sure you discuss any questions you have with your health care provider. Document Revised: 11/23/2018 Document Reviewed: 03/18/2017 Elsevier Patient Education  2020 Elsevier Inc.  

## 2019-09-16 NOTE — Progress Notes (Signed)
Adolescent Well Care Visit Paige Wright is a 19 y.o. female who is here for well care.    PCP:  Richrd Sox, MD   History was provided by the patient.  Confidentiality was discussed with the patient and, if applicable, with caregiver as well.  Current Issues: Current concerns include  Has had bump on wrist for years, states that she does not recall a referral to Orthopedics after her last visit here in 2018 for this, but referral was ordered in Epic.    Nutrition: Nutrition/Eating Behaviors:  Does not eat very healthy, lots of fatty foods  Adequate calcium in diet?: no  Supplements/ Vitamins:  No   Exercise/ Media: Play any Sports?/ Exercise: no  Screen Time:  > 2 hours-counseling provided  Sleep:  Sleep: normal   Social Screening: Lives with:  Female friend   Menstruation:   No LMP recorded. (Menstrual status: Irregular Periods). Menstrual History: had Nexplanon removed in Oct 2020, periods are "irregular" and she states that they have been for years    Confidential Social History: Tobacco?  no Secondhand smoke exposure?  no Drugs/ETOH?  no  Sexually Active?  Not currently  Pregnancy Prevention:  condoms  Safe at home, in school & in relationships?  Yes Safe to self?  Yes   Screenings: Patient has a dental home: yes  PHQ-9 completed and results indicated 0  Physical Exam:  Vitals:   09/16/19 1053  BP: 120/78  Weight: 224 lb 4 oz (101.7 kg)  Height: 5' 6.5" (1.689 m)   BP 120/78   Ht 5' 6.5" (1.689 m)   Wt 224 lb 4 oz (101.7 kg)   BMI 35.65 kg/m  Body mass index: body mass index is 35.65 kg/m. Growth percentile SmartLinks can only be used for patients less than 72 years old.   Hearing Screening   125Hz  250Hz  500Hz  1000Hz  2000Hz  3000Hz  4000Hz  6000Hz  8000Hz   Right ear:   20 20 20 20 20     Left ear:   20 20 20 20 20       Visual Acuity Screening   Right eye Left eye Both eyes  Without correction: 20/20 20/40   With correction:        General Appearance:   alert, oriented, no acute distress  HENT: Normocephalic, no obvious abnormality, conjunctiva clear  Mouth:   Normal appearing teeth, no obvious discoloration, dental caries, or dental caps  Neck:   Supple; thyroid: no enlargement, symmetric, no tenderness/mass/nodules  Chest Normal   Lungs:   Clear to auscultation bilaterally, normal work of breathing  Heart:   Regular rate and rhythm, S1 and S2 normal, no murmurs;   Abdomen:   Soft, non-tender, no mass, or organomegaly  GU genitalia not examined  Musculoskeletal:   Soft, circular lesion on left wrist               Lymphatic:   No cervical adenopathy  Skin/Hair/Nails:   Skin warm, dry and intact, no rashes, no bruises or petechiae  Neurologic:   Strength, gait, and coordination normal and age-appropriate     Assessment and Plan:   .1. Encounter for general adult medical examination with abnormal findings - GC/Chlamydia Probe Amp  2. Body mass index, pediatric, greater than or equal to 95th percentile for age  10. Ganglion cyst - Ambulatory referral to Orthopedic Surgery   BMI is not appropriate for age  Hearing screening result:normal Vision screening result: normal  Counseling provided for all of the vaccine components  Orders Placed This Encounter  Procedures  . GC/Chlamydia Probe Amp  . Ambulatory referral to Orthopedic Surgery     Return in about 1 year (around 09/15/2020).Fransisca Connors, MD

## 2019-09-18 LAB — GC/CHLAMYDIA PROBE AMP
Chlamydia trachomatis, NAA: NEGATIVE
Neisseria Gonorrhoeae by PCR: NEGATIVE

## 2019-09-27 ENCOUNTER — Encounter: Payer: Self-pay | Admitting: Orthopedic Surgery

## 2019-11-01 ENCOUNTER — Telehealth: Payer: Self-pay | Admitting: Pediatrics

## 2019-11-01 NOTE — Telephone Encounter (Signed)
Pt stopped by to discuss needing a letter from PCP stating she is mentally capable of being her own payee over her SSI checks now that she is 19yo. States she is working and is responsible enough to handle it herself. Is requesting letter to send on her behalf to get the SSI checks in her name

## 2019-11-02 ENCOUNTER — Encounter: Payer: Self-pay | Admitting: Pediatrics

## 2019-11-02 NOTE — Telephone Encounter (Signed)
Letter for SSI Payments ready for printing and pick up.  Thank you!

## 2019-11-02 NOTE — Telephone Encounter (Signed)
Pt informed

## 2019-12-15 ENCOUNTER — Other Ambulatory Visit: Payer: Self-pay

## 2019-12-16 ENCOUNTER — Ambulatory Visit: Payer: Medicaid Other | Attending: Internal Medicine

## 2019-12-16 ENCOUNTER — Other Ambulatory Visit: Payer: Self-pay

## 2019-12-16 DIAGNOSIS — Z20822 Contact with and (suspected) exposure to covid-19: Secondary | ICD-10-CM

## 2019-12-17 LAB — NOVEL CORONAVIRUS, NAA: SARS-CoV-2, NAA: NOT DETECTED

## 2019-12-17 LAB — SARS-COV-2, NAA 2 DAY TAT

## 2020-02-02 ENCOUNTER — Ambulatory Visit (INDEPENDENT_AMBULATORY_CARE_PROVIDER_SITE_OTHER): Payer: Medicaid Other | Admitting: *Deleted

## 2020-02-02 ENCOUNTER — Encounter: Payer: Self-pay | Admitting: *Deleted

## 2020-02-02 VITALS — BP 136/80 | HR 84 | Ht 67.0 in | Wt 235.0 lb

## 2020-02-02 DIAGNOSIS — Z3202 Encounter for pregnancy test, result negative: Secondary | ICD-10-CM

## 2020-02-02 DIAGNOSIS — Z3200 Encounter for pregnancy test, result unknown: Secondary | ICD-10-CM

## 2020-02-02 LAB — POCT URINE PREGNANCY: Preg Test, Ur: NEGATIVE

## 2020-02-02 NOTE — Progress Notes (Signed)
° °  NURSE VISIT- PREGNANCY CONFIRMATION   SUBJECTIVE:  Paige Wright is a 19 y.o. G0P0000 female at Unknown by uncertain LMP of No LMP recorded. (Menstrual status: Irregular Periods). Here for pregnancy confirmation.  Home pregnancy test: positive x two.  She reports no complaints.  She is not taking prenatal vitamins.    OBJECTIVE:  BP 136/80 (BP Location: Left Arm, Patient Position: Sitting, Cuff Size: Normal)    Pulse 84    Ht 5\' 7"  (1.702 m)    Wt 235 lb (106.6 kg)    BMI 36.81 kg/m   Appears well, in no apparent distress OB History  Gravida Para Term Preterm AB Living  0 0 0 0 0 0  SAB TAB Ectopic Multiple Live Births  0 0 0 0 0    Results for orders placed or performed in visit on 02/02/20 (from the past 24 hour(s))  POCT urine pregnancy   Collection Time: 02/02/20 11:21 AM  Result Value Ref Range   Preg Test, Ur Negative Negative    ASSESSMENT: Negative pregnancy test. Sending patient for lab work.   PLAN: Schedule for dating ultrasound in not scheduled at this time. Prenatal vitamins: no prenatals started at this time.    Nausea medicines: not currently needed   OB packet given: No  02/04/20  02/02/2020 11:23 AM

## 2020-02-02 NOTE — Progress Notes (Signed)
Chart reviewed for nurse visit. Agree with plan of care. Has appt 6/24 with Drenda Freeze for irregular cycles Adline Potter, NP 02/02/2020 11:56 AM

## 2020-02-03 LAB — BETA HCG QUANT (REF LAB): hCG Quant: <1 m[IU]/mL

## 2020-02-09 ENCOUNTER — Ambulatory Visit: Payer: Medicaid Other | Admitting: Advanced Practice Midwife

## 2020-02-16 ENCOUNTER — Encounter: Payer: Self-pay | Admitting: Advanced Practice Midwife

## 2020-02-16 ENCOUNTER — Ambulatory Visit (INDEPENDENT_AMBULATORY_CARE_PROVIDER_SITE_OTHER): Payer: Medicaid Other | Admitting: Advanced Practice Midwife

## 2020-02-16 VITALS — BP 110/74 | HR 96 | Ht 67.0 in | Wt 243.0 lb

## 2020-02-16 DIAGNOSIS — N926 Irregular menstruation, unspecified: Secondary | ICD-10-CM | POA: Diagnosis not present

## 2020-02-16 MED ORDER — MEDROXYPROGESTERONE ACETATE 10 MG PO TABS: 10.0000 mg | ORAL_TABLET | Freq: Every day | ORAL | 0 refills | Status: DC

## 2020-02-16 MED ORDER — PNV PRENATAL PLUS MULTIVITAMIN 27-1 MG PO TABS: 1.0000 | ORAL_TABLET | Freq: Every day | ORAL | 11 refills | Status: DC

## 2020-02-16 MED ORDER — CLOMIPHENE CITRATE 50 MG PO TABS: 50.0000 mg | ORAL_TABLET | Freq: Every day | ORAL | 5 refills | Status: DC

## 2020-02-16 NOTE — Patient Instructions (Addendum)
Let me know if you don't ovulate (get a box of ovulation predictor sticks from [employer], etc.and use according to directions) or if not pregant after 6 cycles of + ovulation.       CLOMID INSTRUCTIONS  WHY USE IT? Clomid helps your ovaries to release eggs (ovulate).  HOW TO USE IT? Clomid is taken as a pill usually on days 1,2,3,4,&5 of your cycle.  Day 1 is the first day of your period. The dose or duration may be changed to achieve ovulation.  Provera (progesterone) may first be used to bring on a period for some patients. The day of ovulation on Clomid is usually between cycle day 14 and 17.  Having sexual intercourse at least every other day between cycle day 13 and 18 will improve your chances of becoming pregnant during the Clomid cycle.  You may monitor your ovulation using basal body temperature charts or with ovulation kits.  If using the ovulation predictor kits, having intercourse the day of the surge and the two days following is recommended. If you get your period, call when it starts for an appointment with your doctor, so that an exam may be done, and another Clomid cycle can be considered if appropriate. If you do not get a period by day 35 of the cycle, please get a blood pregnancy test.  If it is negative, speak to your doctor for instructions to bring on another period and to plan a follow-up appointment.  THINGS TO KNOW: If you get pregnant while using Clomid, your chance of twins is 7%m and triplets is less than 1%. Some studies have suggested the use of "fertility drugs" may increase your risk of ovarian cancers in the future.  It is unclear if these drugs increase the risk, or people who have problems with fertility are prone for these cancers.  If there is an actual risk, it is very low.  If you have a history of liver problems or ovarian cancer, it may be wise to avoid this medication.  SIDE EFFECTS:  The most common side effect is hot flashes (20%).  Breast  tenderness, headaches, nausea, bloating may also occur at different times.  Less than 3/1,000 people have dryness or loss of hair.  Persistent ovarian cysts may form from the use of this medication.  Ovarian hyperstimulation syndrome is a rare side effect at low doses.  Visual changes like flashes of light or blurring.     Diet for Polycystic Ovary Syndrome Polycystic ovary syndrome (PCOS) is a disorder of the chemicals (hormones) that regulate a woman's reproductive system, including monthly periods (menstruation). The condition causes important hormones to be out of balance. PCOS can:  Stop your periods or make them irregular.  Cause cysts to develop on your ovaries.  Make it difficult to get pregnant.  Stop your body from responding to the effects of insulin (insulin resistance). Insulin resistance can lead to obesity and diabetes. Changing what you eat can help you manage PCOS and improve your health. Following a balanced diet can help you lose weight and improve the way that your body uses insulin. What are tips for following this plan?  Follow a balanced diet for meals and snacks. Eat breakfast, lunch, dinner, and one or two snacks every day.  Include protein in each meal and snack.  Choose whole grains instead of products that are made with refined flour.  Eat a variety of foods.  Exercise regularly as told by your health care provider. Aim  to do 30 or more minutes of exercise on most days of the week.  If you are overweight or obese: ? Pay attention to how many calories you eat. Cutting down on calories can help you lose weight. ? Work with your health care provider or a diet and nutrition specialist (dietitian) to figure out how many calories you need each day. What foods can I eat?  Fruits Include a variety of colors and types. All fruits are helpful for PCOS. Vegetables Include a variety of colors and types. All vegetables are helpful for PCOS. Grains Whole  grains, such as whole wheat. Whole-grain breads, crackers, cereals, and pasta. Unsweetened oatmeal, bulgur, barley, quinoa, and brown rice. Tortillas made from corn or whole-wheat flour. Meats and other proteins Low-fat (lean) proteins, such as fish, chicken, beans, eggs, and tofu. Dairy Low-fat dairy products, such as skim milk, cheese sticks, and yogurt. Beverages Low-fat or fat-free drinks, such as water, low-fat milk, sugar-free drinks, and small amounts of 100% fruit juice. Seasonings and condiments Ketchup. Mustard. Barbecue sauce. Relish. Low-fat or fat-free mayonnaise. Fats and oils Olive oil or canola oil. Walnuts and almonds. The items listed above may not be a complete list of recommended foods and beverages. Contact a dietitian for more options. What foods are not recommended? Foods that are high in calories or fat. Fried foods. Sweets. Products that are made from refined white flour, including white bread, pastries, white rice, and pasta. The items listed above may not be a complete list of foods and beverages to avoid. Contact a dietitian for more information. Summary  PCOS is a hormonal imbalance that affects a woman's reproductive system.  You can help to manage your PCOS by exercising regularly and eating a healthy, varied diet of vegetables, fruit, whole grains, low-fat (lean) protein, and low-fat dairy products.  Changing what you eat can improve the way that your body uses insulin, help your hormones reach normal levels, and help you lose weight. This information is not intended to replace advice given to you by your health care provider. Make sure you discuss any questions you have with your health care provider. Document Revised: 11/24/2018 Document Reviewed: 06/08/2017 Elsevier Patient Education  2020 [employer] Inc.  Polycystic Ovarian Syndrome  Polycystic ovarian syndrome (PCOS) is a common hormonal disorder among women of reproductive age. In most women with PCOS,  many small fluid-filled sacs (cysts) grow on the ovaries, and the cysts are not part of a normal menstrual cycle. PCOS can cause problems with your menstrual periods and make it difficult to get pregnant. It can also cause an increased risk of miscarriage with pregnancy. If it is not treated, PCOS can lead to serious health problems, such as diabetes and heart disease. What are the causes? The cause of PCOS is not known, but it may be the result of a combination of certain factors, such as:  Irregular menstrual cycle.  High levels of certain hormones (androgens).  Problems with the hormone that helps to control blood sugar (insulin resistance).  Certain genes. What increases the risk? This condition is more likely to develop in women who have a family history of PCOS. What are the signs or symptoms? Symptoms of PCOS may include:  Multiple ovarian cysts.  Infrequent periods or no periods.  Periods that are too frequent or too heavy.  Unpredictable periods.  Inability to get pregnant (infertility) because of not ovulating.  Increased growth of hair on the face, chest, stomach, back, thumbs, thighs, or toes.  Acne  or oily skin. Acne may develop during adulthood, and it may not respond to treatment.  Pelvic pain.  Weight gain or obesity.  Patches of thickened and dark brown or black skin on the neck, arms, breasts, or thighs (acanthosis nigricans).  Excess hair growth on the face, chest, abdomen, or upper thighs (hirsutism). How is this diagnosed? This condition is diagnosed based on:  Your medical history.  A physical exam, including a pelvic exam. Your health care provider may look for areas of increased hair growth on your skin.  Tests, such as: ? Ultrasound. This may be used to examine the ovaries and the lining of the uterus (endometrium) for cysts. ? Blood tests. These may be used to check levels of sugar (glucose), female hormone (testosterone), and female hormones  (estrogen and progesterone) in your blood. How is this treated? There is no cure for PCOS, but treatment can help to manage symptoms and prevent more health problems from developing. Treatment varies depending on:  Your symptoms.  Whether you want to have a baby or whether you need birth control (contraception). Treatment may include nutrition and lifestyle changes along with:  Progesterone hormone to start a menstrual period.  Birth control pills to help you have regular menstrual periods.  Medicines to make you ovulate, if you want to get pregnant.  Medicine to reduce excessive hair growth.  Surgery, in severe cases. This may involve making small holes in one or both of your ovaries. This decreases the amount of testosterone that your body produces. Follow these instructions at home:  Take over-the-counter and prescription medicines only as told by your health care provider.  Follow a healthy meal plan. This can help you reduce the effects of PCOS. ? Eat a healthy diet that includes lean proteins, complex carbohydrates, fresh fruits and vegetables, low-fat dairy products, and healthy fats. Make sure to eat enough fiber.  If you are overweight, lose weight as told by your health care provider. ? Losing 10% of your body weight may improve symptoms. ? Your health care provider can determine how much weight loss is best for you and can help you lose weight safely.  Keep all follow-up visits as told by your health care provider. This is important. Contact a health care provider if:  Your symptoms do not get better with medicine.  You develop new symptoms. This information is not intended to replace advice given to you by your health care provider. Make sure you discuss any questions you have with your health care provider. Document Revised: 07/17/2017 Document Reviewed: 01/20/2016 Elsevier Patient Education  2020 [employer].

## 2020-02-16 NOTE — Progress Notes (Signed)
[facility] Clinic Visit  Patient name: Paige Wright MRN [id number]  Date of birth: November 11, 2000  CC & HPI:  Paige Wright is a 19 y.o. African American female presenting today for irregular periods.  Wants to be pregnant. Using [employer] UPTs, some are + . LMP 9 months ago. Has always been irregular .Has hair on chest.   Pertinent History Reviewed:  Medical & Surgical Hx:   Past Medical History:  Diagnosis Date   ADHD (attention deficit hyperactivity disorder)    Anxiety    Bacterial vaginosis 09/06/2014   Contraceptive education 11/01/2013   Ganglion cyst    Hallucination    Hypertension    Ingrown left greater toenail 05/20/2013   Lives in group home 10/04/2015   Schizophrenia (HCC)    History reviewed. No pertinent surgical history. Family History  Problem Relation Age of Onset   HIV/AIDS Mother    Mental illness Mother    Hypertension Mother    Kidney disease Mother    Mental illness Father    HIV/AIDS Father    Asthma Sister    Diabetes Maternal Grandmother    Hypertension Maternal Grandmother    Hypertension Maternal Grandfather    Mental illness Paternal Grandmother    Mental illness Paternal Grandfather    Mental illness Maternal Aunt    Hypertension Maternal Aunt    Diabetes Maternal Aunt    Cancer Maternal Aunt    Mental illness Paternal Aunt    Mental illness Paternal Uncle    No current outpatient medications on file. Social History: Reviewed -  reports that she has been smoking cigarettes. She has been smoking about 0.25 packs per day. She has never used smokeless tobacco.  Review of Systems:   Constitutional: Negative for fever and chills Eyes: Negative for visual disturbances Respiratory: Negative for shortness of breath, dyspnea Cardiovascular: Negative for chest pain or palpitations  Gastrointestinal: Negative for vomiting, diarrhea and constipation; no abdominal pain Genitourinary: Negative for dysuria  and urgency, vaginal irritation or itching Musculoskeletal: Negative for back pain, joint pain, myalgias  Neurological: Negative for dizziness and headaches    Objective Findings:    Physical Examination: Vitals:   02/16/20 1511  BP: 110/74  Pulse: 96   General appearance - well appearing, and in no distress Mental status - alert, oriented to person, place, and time Chest:  Normal respiratory effort Heart - normal rate and regular rhythm Abdomen:  Soft, nontender Pelvic: deferred Musculoskeletal:  Normal range of motion without pain Extremities:  No edema    No results found for this or any previous visit (from the past 24 hour(s)).    Assessment & Plan:  A:   Irregular menses P:  Provera challenge.  Clomid 50mg  day 1-5 . Let me know if she doesn't ovulate or if not pregant after 6 cycles of + ovulation.    No follow-ups on file.  CNM 02/16/2020 3:41 PM

## 2020-03-05 ENCOUNTER — Telehealth: Payer: Self-pay | Admitting: Advanced Practice Midwife

## 2020-03-05 NOTE — Telephone Encounter (Signed)
Left message @ 5:12 pm. JSY

## 2020-03-05 NOTE — Telephone Encounter (Signed)
Patient was calling because she was told to call once she started her medication and her cycle came on, and wants someone to give her a call back.

## 2020-03-05 NOTE — Telephone Encounter (Addendum)
Pt has been taking Provera and started period on 03/02/20. Took last Provera on 02/29/20. Pt started Clomid on 02/16/20 and took it for 5 days. Pt states she wasn't told how to take the Clomid. Today is day 3 of period. Shouldn't she start Clomid today? Please advise. Thanks!! JSY

## 2020-03-07 NOTE — Telephone Encounter (Signed)
Left message @ 4:52 pm. JSY

## 2020-03-09 NOTE — Telephone Encounter (Signed)
Pt aware to take Clomid on days 3-7 of cycle and check progesterone on day 21. Advised to call us so we can get her on lab schedule for progesterone. Pt voiced understanding. JSY

## 2020-03-12 ENCOUNTER — Other Ambulatory Visit: Payer: Self-pay

## 2020-03-12 ENCOUNTER — Emergency Department (HOSPITAL_COMMUNITY)
Admission: EM | Admit: 2020-03-12 | Discharge: 2020-03-13 | Disposition: A | Discharge status: Home / Self Care | Payer: Medicaid Other | Attending: Emergency Medicine | Admitting: Emergency Medicine

## 2020-03-12 ENCOUNTER — Encounter (HOSPITAL_COMMUNITY): Payer: Self-pay | Admitting: Emergency Medicine

## 2020-03-12 DIAGNOSIS — F32A Depression, unspecified: Secondary | ICD-10-CM

## 2020-03-12 DIAGNOSIS — F329 Major depressive disorder, single episode, unspecified: Secondary | ICD-10-CM | POA: Insufficient documentation

## 2020-03-12 DIAGNOSIS — R45851 Suicidal ideations: Secondary | ICD-10-CM | POA: Insufficient documentation

## 2020-03-12 DIAGNOSIS — F17210 Nicotine dependence, cigarettes, uncomplicated: Secondary | ICD-10-CM | POA: Insufficient documentation

## 2020-03-12 DIAGNOSIS — I10 Essential (primary) hypertension: Secondary | ICD-10-CM | POA: Insufficient documentation

## 2020-03-12 LAB — COMPREHENSIVE METABOLIC PANEL
ALT: 36 U/L (ref 0–44)
AST: 32 U/L (ref 15–41)
Albumin: 4.7 g/dL (ref 3.5–5.0)
Alkaline Phosphatase: 61 U/L (ref 38–126)
Anion gap: 11 (ref 5–15)
BUN: 14 mg/dL (ref 6–20)
CO2: 22 mmol/L (ref 22–32)
Calcium: 9.1 mg/dL (ref 8.9–10.3)
Chloride: 108 mmol/L (ref 98–111)
Creatinine, Ser: 0.65 mg/dL (ref 0.44–1.00)
GFR calc Af Amer: >60 mL/min (ref >60)
GFR calc non Af Amer: >60 mL/min (ref >60)
Glucose, Bld: 91 mg/dL (ref 70–99)
Potassium: 3.1 mmol/L — ABNORMAL LOW (ref 3.5–5.1)
Sodium: 141 mmol/L (ref 135–145)
Total Bilirubin: 0.5 mg/dL (ref 0.3–1.2)
Total Protein: 7.9 g/dL (ref 6.5–8.1)

## 2020-03-12 LAB — CBC WITH DIFFERENTIAL/PLATELET
Abs Immature Granulocytes: 0.03 10*3/uL (ref 0.00–0.07)
Basophils Absolute: 0 10*3/uL (ref 0.0–0.1)
Basophils Relative: 0 %
Eosinophils Absolute: 0.2 10*3/uL (ref 0.0–0.5)
Eosinophils Relative: 2 %
HCT: 38.7 % (ref 36.0–46.0)
Hemoglobin: 12 g/dL (ref 12.0–15.0)
Immature Granulocytes: 0 %
Lymphocytes Relative: 25 %
Lymphs Abs: 2.3 10*3/uL (ref 0.7–4.0)
MCH: 27.8 pg (ref 26.0–34.0)
MCHC: 31 g/dL (ref 30.0–36.0)
MCV: 89.8 fL (ref 80.0–100.0)
Monocytes Absolute: 0.5 10*3/uL (ref 0.1–1.0)
Monocytes Relative: 6 %
Neutro Abs: 6.1 10*3/uL (ref 1.7–7.7)
Neutrophils Relative %: 67 %
Platelets: 346 10*3/uL (ref 150–400)
RBC: 4.31 MIL/uL (ref 3.87–5.11)
RDW: 14.1 % (ref 11.5–15.5)
WBC: 9.2 10*3/uL (ref 4.0–10.5)
nRBC: 0 % (ref 0.0–0.2)

## 2020-03-12 LAB — ETHANOL: Alcohol, Ethyl (B): <10 mg/dL (ref <10)

## 2020-03-12 NOTE — ED Provider Notes (Signed)
[facility] EMERGENCY DEPARTMENT Provider Note   CSN: [id number] Arrival date & time: 03/12/20  2126     History Chief Complaint  Patient presents with  . V70.1  . Suicidal    Paige Wright is a 19 y.o. female.  HPI   This patient is a 19 year old female, she has a history of hallucinations, schizophrenia, she had been living in a group home, she had lost her place to live because she took in a gentleman who was homeless.  Since that time she has lost her apartment and now is living with the boyfriend, his father, there aunt, and states that there is some friction between herself and her boyfriend's mother.  She states that tonight she became very upset and was severely depressed, states that she was going to walk into traffic to get hit and killed.  On the time that she arrives by ambulance the patient refuses to give [place] a name, she refuses to follow commands, she refuses to answer questions but ultimately over time she was able to state that she was just upset and said things that she did not mean.  She denies that she is having active hallucinations, denies that she is actively suicidal, states that the corner of her depression is that she does not have a good relationship with her boyfriend's mother who causes trouble.  In fact she states she got out of jail 3 days ago after multiple charges that were brought against her by the boyfriend's mother.  She denies alcohol, she does endorse tobacco, she denies other drugs  Past Medical History:  Diagnosis Date  . ADHD (attention deficit hyperactivity disorder)   . Anxiety   . Bacterial vaginosis 09/06/2014  . Contraceptive education 11/01/2013  . Ganglion cyst   . Hallucination   . Hypertension   . Ingrown left greater toenail 05/20/2013  . Lives in group home 10-05-2015  . Schizophrenia [facility])     Patient Active Problem List   Diagnosis Date Noted  . Ganglion cyst 09/16/2019  . Vaginal yeast infection 08/24/2019  . Severe  recurrent major depression without psychotic features (HCC) 01/28/2019  . Encounter for Nexplanon removal 03/19/2017  . Missed periods 03/19/2017  . Death of parent 10/05/15  . Schizoaffective disorder (HCC) 07/29/2015  . Screening examination for STD (sexually transmitted disease) 11/01/2013  . ADHD (attention deficit hyperactivity disorder), combined type 11/08/2012  . Major depressive disorder, recurrent episode, moderate (HCC) 11/05/2012  . Generalized anxiety disorder 11/05/2012    History reviewed. No pertinent surgical history.   OB History    Gravida  0   Para  0   Term  0   Preterm  0   AB  0   Living  0     SAB  0   TAB  0   Ectopic  0   Multiple  0   Live Births  0           Family History  Problem Relation Age of Onset  . HIV/AIDS Mother   . Mental illness Mother   . Hypertension Mother   . Kidney disease Mother   . Mental illness Father   . HIV/AIDS Father   . Asthma Sister   . Diabetes Maternal Grandmother   . Hypertension Maternal Grandmother   . Hypertension Maternal Grandfather   . Mental illness Paternal Grandmother   . Mental illness Paternal Grandfather   . Mental illness Maternal Aunt   . Hypertension Maternal Aunt   .  Diabetes Maternal Aunt   . Cancer Maternal Aunt   . Mental illness Paternal Aunt   . Mental illness Paternal Uncle     Social History   Tobacco Use  . Smoking status: Current Every Day Smoker    Packs/day: 0.25    Types: Cigarettes  . Smokeless tobacco: Never Used  Vaping Use  . Vaping Use: Former  Substance Use Topics  . Alcohol use: Not Currently  . Drug use: Not Currently    Types: Marijuana    Home Medications Prior to Admission medications   Medication Sig Start Date End Date Taking? Authorizing Provider  Prenatal Vit-Fe Fumarate-FA (PNV PRENATAL PLUS MULTIVITAMIN) 27-1 MG TABS Take 1 tablet by mouth daily. 02/16/20  Yes Cresenzo-Dishmon, Scarlette Calico, CNM  clomiPHENE (CLOMID) 50 MG tablet Take 1  tablet (50 mg total) by mouth daily. Patient not taking: Reported on 03/13/2020 02/16/20   Cresenzo-Dishmon, Scarlette Calico, CNM  medroxyPROGESTERone (PROVERA) 10 MG tablet Take 1 tablet (10 mg total) by mouth daily. Patient not taking: Reported on 03/13/2020 02/16/20   Jacklyn Shell, CNM    Allergies    Patient has no known allergies.  Review of Systems   Review of Systems  All other systems reviewed and are negative.   Physical Exam Updated Vital Signs BP 127/83 (BP Location: Right Arm)   Pulse 88   Temp 98 F (36.7 C) (Oral)   Resp 16   Ht 1.702 m (5\' 7" )   Wt (!) 111 kg   LMP 03/02/2020   SpO2 100%   BMI 38.33 kg/m   Physical Exam Vitals and nursing note reviewed.  Constitutional:      General: She is not in acute distress.    Appearance: She is well-developed.  HENT:     Head: Normocephalic and atraumatic.     Mouth/Throat:     Pharynx: No oropharyngeal exudate.  Eyes:     General: No scleral icterus.       Right eye: No discharge.        Left eye: No discharge.     Conjunctiva/sclera: Conjunctivae normal.     Pupils: Pupils are equal, round, and reactive to light.  Neck:     Thyroid: No thyromegaly.     Vascular: No JVD.  Cardiovascular:     Rate and Rhythm: Normal rate and regular rhythm.     Heart sounds: Normal heart sounds. No murmur heard.  No friction rub. No gallop.   Pulmonary:     Effort: Pulmonary effort is normal. No respiratory distress.     Breath sounds: Normal breath sounds. No wheezing or rales.  Abdominal:     General: Bowel sounds are normal. There is no distension.     Palpations: Abdomen is soft. There is no mass.     Tenderness: There is no abdominal tenderness.  Musculoskeletal:        General: No tenderness. Normal range of motion.     Cervical back: Normal range of motion and neck supple.  Lymphadenopathy:     Cervical: No cervical adenopathy.  Skin:    General: Skin is warm and dry.     Findings: No erythema or rash.   Neurological:     Mental Status: She is alert.     Coordination: Coordination normal.  Psychiatric:        Behavior: Behavior normal.     Comments: The patient is tearful, yelling, endorsing depression on arrival.     ED Results / Procedures / Treatments  Labs (all labs ordered are listed, but only abnormal results are displayed) Labs Reviewed  COMPREHENSIVE METABOLIC PANEL - Abnormal; Notable for the following components:      Result Value   Potassium 3.1 (*)    All other components within normal limits  CBC WITH DIFFERENTIAL/PLATELET  ETHANOL    EKG None  Radiology No results found.  Procedures Procedures (including critical care time)  Medications Ordered in ED Medications  potassium chloride SA (KLOR-CON) CR tablet 40 mEq (40 mEq Oral Given 03/13/20 1116)    ED Course  I have reviewed the triage vital signs and the nursing notes.  Pertinent labs & imaging results that were available during my care of the patient were reviewed by me and considered in my medical decision making (see chart for details).    MDM Rules/Calculators/A&P                          This patient appears to be apologetic for things that she had said, states that she did not mean them, she is calm down significantly.  She states she is no longer suicidal.  She will need to be seen by psychiatry prior to being cleared.  She does not appear to be responding to internal stimuli  At change of shift - patient pending disposition  Final Clinical Impression(s) / ED Diagnoses Final diagnoses:  Depression, unspecified depression type  Suicidal ideation    Rx / DC Orders ED Discharge Orders    None       Eber Hong, MD 03/14/20 1323

## 2020-03-12 NOTE — ED Triage Notes (Addendum)
Pt brought in by [employer]. Per [employer] pt was threatening suicide by tying and apple phone charging cord around her neck. Pt denies SI/Hi at this time. Denies visual or auditory hallucinations.

## 2020-03-13 MED ORDER — STERILE WATER FOR INJECTION IJ SOLN
INTRAMUSCULAR | Status: AC
  Filled 2020-03-13: qty 10

## 2020-03-13 MED ORDER — POTASSIUM CHLORIDE CRYS ER 20 MEQ PO TBCR
40.0000 meq | EXTENDED_RELEASE_TABLET | Freq: Once | ORAL | Status: AC
  Administered 2020-03-13: 40 meq via ORAL
  Filled 2020-03-13: qty 2

## 2020-03-13 MED ORDER — ZIPRASIDONE MESYLATE 20 MG IM SOLR
20.0000 mg | Freq: Once | INTRAMUSCULAR | Status: DC
  Filled 2020-03-13: qty 20

## 2020-03-13 NOTE — BH Assessment (Signed)
Per Denzil Magnuson, NP the patient no longer meets criteria for inpatient psychiatric treatment.  Patient has been psychiatrically cleared.   It is recommended for the patient to re-establish her relationship with her outpatient providers at [facility] for outpatient medication management and therapy.   Dominica Severin, RN notified.    Baldo Daub, MSW, LCSW Clinical Social Worker [facility]

## 2020-03-13 NOTE — ED Provider Notes (Signed)
TTS consultation is appreciated.  Patient meets inpatient criteria.  She will not stay voluntarily.  IVC paperwork is filled out.     Dione Booze, MD 03/13/20 [phone]
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
## 2020-03-13 NOTE — ED Notes (Signed)
[facility] in room and informed pt she is now IVC'd and to cooperate; pt ageeable and asking to take a shower; pt given scrubs, towels, washcloths and taken to the shower

## 2020-03-13 NOTE — Discharge Instructions (Addendum)
Follow up per [facility].

## 2020-03-13 NOTE — ED Notes (Addendum)
Nira Conn, NP, patient meets inpatient criteria. TTS to secure placement. Casimiro Needle, RN, informed of disposition.

## 2020-03-14 ENCOUNTER — Other Ambulatory Visit: Payer: Self-pay | Admitting: Advanced Practice Midwife

## 2020-03-20 ENCOUNTER — Telehealth: Payer: Self-pay | Admitting: Advanced Practice Midwife

## 2020-03-20 NOTE — Telephone Encounter (Signed)
Patient wants to know when to start her meds. She wants to know do she start it the day of her lab or another day.

## 2020-03-20 NOTE — Telephone Encounter (Signed)
Pt's last period started on 03/02/20. When does she start the Provera? Drenda Freeze saw this pt but pt is unsure on directions. Thanks!! JSY

## 2020-03-21 NOTE — Telephone Encounter (Signed)
Mailbox full @ 10:50 am. [employer]

## 2020-03-22 ENCOUNTER — Telehealth: Payer: Self-pay | Admitting: *Deleted

## 2020-03-22 NOTE — Telephone Encounter (Signed)
Pt took Provera and Clomid together in July. Pt wants to know when she should take Provera to start period. Does she start Clomid on day 3 of period? Please advise. Thanks!! JSY

## 2020-03-22 NOTE — Telephone Encounter (Signed)
Mailbox full @ 2:39 pm. JSY

## 2020-03-22 NOTE — Telephone Encounter (Signed)
Pt returning your call. Told her you tried to call but VM was full.

## 2020-03-23 ENCOUNTER — Other Ambulatory Visit: Payer: Medicaid Other

## 2020-03-23 DIAGNOSIS — N926 Irregular menstruation, unspecified: Secondary | ICD-10-CM

## 2020-03-24 LAB — PROGESTERONE: Progesterone: 0.2 ng/mL

## 2020-03-26 NOTE — Telephone Encounter (Signed)
Drenda Freeze sent pt a MyChart message. Encounter closed. JSY

## 2020-05-17 ENCOUNTER — Telehealth: Payer: Self-pay | Admitting: Women's Health

## 2020-05-17 ENCOUNTER — Telehealth: Payer: Self-pay | Admitting: *Deleted

## 2020-05-17 ENCOUNTER — Other Ambulatory Visit: Payer: Medicaid Other

## 2020-05-17 DIAGNOSIS — N926 Irregular menstruation, unspecified: Secondary | ICD-10-CM

## 2020-05-17 NOTE — Telephone Encounter (Signed)
Patient called back and stated last dose of medication was Sept 9 not Sept 16. Advised would need to have labs drawn today. Patient voiced understanding.

## 2020-05-17 NOTE — Telephone Encounter (Signed)
Pt states she is supposed to have progesterone level checked every 21 days. Last check was 03/23/20 I added her on lab schedule for tomorrow morning. It looks like she is past due for re check can you confirm if she needs lab orders placed

## 2020-05-17 NOTE — Telephone Encounter (Signed)
Telephoned patient at home number and advised would need to have blood work done Oct 7th. Last dose was Sept 17 and has be 21 days. Patient voiced understanding.

## 2020-05-18 ENCOUNTER — Other Ambulatory Visit: Payer: Medicaid Other

## 2020-05-18 LAB — PROGESTERONE: Progesterone: 0.2 ng/mL

## 2020-06-14 ENCOUNTER — Other Ambulatory Visit: Payer: Medicaid Other

## 2020-07-23 ENCOUNTER — Ambulatory Visit: Payer: Medicaid Other | Admitting: Women's Health

## 2020-08-19 ENCOUNTER — Other Ambulatory Visit: Payer: Self-pay

## 2020-08-19 ENCOUNTER — Emergency Department (HOSPITAL_COMMUNITY)
Admission: EM | Admit: 2020-08-19 | Discharge: 2020-08-19 | Disposition: A | Discharge status: Home / Self Care | Payer: Medicaid Other

## 2020-09-11 ENCOUNTER — Telehealth: Payer: Self-pay | Admitting: Advanced Practice Midwife

## 2020-09-11 NOTE — Telephone Encounter (Signed)
Attempted to call patient back, no answer and no option to leave voicemail.

## 2020-09-11 NOTE — Telephone Encounter (Signed)
Patient wants Paige Wright to refill her hormone medication that helps with pregnancy and stops menstrual cycle, per patient. Patient didn't know the name of the medication. Clinical staff will follow up with patient.

## 2020-09-14 ENCOUNTER — Other Ambulatory Visit: Payer: Self-pay

## 2020-09-14 ENCOUNTER — Encounter (HOSPITAL_COMMUNITY): Payer: Self-pay | Admitting: *Deleted

## 2020-09-14 DIAGNOSIS — N926 Irregular menstruation, unspecified: Secondary | ICD-10-CM | POA: Diagnosis not present

## 2020-09-14 DIAGNOSIS — F17210 Nicotine dependence, cigarettes, uncomplicated: Secondary | ICD-10-CM | POA: Diagnosis not present

## 2020-09-14 DIAGNOSIS — N3090 Cystitis, unspecified without hematuria: Secondary | ICD-10-CM | POA: Insufficient documentation

## 2020-09-14 DIAGNOSIS — I10 Essential (primary) hypertension: Secondary | ICD-10-CM | POA: Insufficient documentation

## 2020-09-14 DIAGNOSIS — R110 Nausea: Secondary | ICD-10-CM | POA: Diagnosis not present

## 2020-09-14 DIAGNOSIS — M545 Low back pain, unspecified: Secondary | ICD-10-CM | POA: Insufficient documentation

## 2020-09-14 DIAGNOSIS — N898 Other specified noninflammatory disorders of vagina: Secondary | ICD-10-CM | POA: Insufficient documentation

## 2020-09-14 DIAGNOSIS — R1030 Lower abdominal pain, unspecified: Secondary | ICD-10-CM | POA: Diagnosis present

## 2020-09-14 NOTE — ED Triage Notes (Signed)
Pt with with lower abd pain for 2 weeks and worse yesterday.  Pink discharge started yesterday. Unsure if she is pregnant.  Pt states her OB/Gyn would not schedule an appointment til 2 weeks out.  Pt asked for something to drink once she entered the triage room, explained to pt nothing to eat or drink til seen by EDP.  Pt stated she is only here for her ovary pain and discharge and will get something to drink out in the waiting room.

## 2020-09-14 NOTE — ED Notes (Signed)
Pt eating pizza and snacks in the lobby.

## 2020-09-15 ENCOUNTER — Emergency Department (HOSPITAL_COMMUNITY)
Admission: EM | Admit: 2020-09-15 | Discharge: 2020-09-15 | Disposition: A | Discharge status: Home / Self Care | Payer: Medicaid Other | Attending: Emergency Medicine | Admitting: Emergency Medicine

## 2020-09-15 DIAGNOSIS — R102 Pelvic and perineal pain: Secondary | ICD-10-CM

## 2020-09-15 DIAGNOSIS — M545 Low back pain, unspecified: Secondary | ICD-10-CM

## 2020-09-15 DIAGNOSIS — N3090 Cystitis, unspecified without hematuria: Secondary | ICD-10-CM

## 2020-09-15 LAB — URINALYSIS, ROUTINE W REFLEX MICROSCOPIC
Bilirubin Urine: NEGATIVE
Glucose, UA: NEGATIVE mg/dL
Hgb urine dipstick: NEGATIVE
Ketones, ur: NEGATIVE mg/dL
Leukocytes,Ua: NEGATIVE
Nitrite: NEGATIVE
Protein, ur: 30 mg/dL — AB
Specific Gravity, Urine: 1.025 (ref 1.005–1.030)
pH: 5 (ref 5.0–8.0)

## 2020-09-15 LAB — POC URINE PREG, ED: Preg Test, Ur: NEGATIVE

## 2020-09-15 MED ORDER — NITROFURANTOIN MONOHYD MACRO 100 MG PO CAPS
100.0000 mg | ORAL_CAPSULE | Freq: Two times a day (BID) | ORAL | 0 refills | Status: DC
Start: 2020-09-15 — End: 2021-06-19

## 2020-09-15 MED ORDER — NITROFURANTOIN MACROCRYSTAL 100 MG PO CAPS
100.0000 mg | ORAL_CAPSULE | Freq: Once | ORAL | Status: AC
  Administered 2020-09-15: 100 mg via ORAL
  Filled 2020-09-15: qty 1
  Filled 2020-09-15: qty 2

## 2020-09-15 NOTE — ED Notes (Signed)
Pts declining the pelvic exam. MD informed.

## 2020-09-15 NOTE — ED Notes (Signed)
MD at the bedside  

## 2020-09-15 NOTE — Discharge Instructions (Addendum)
He would not allow a pelvic exam to be done.  Without that, we were unable to evaluate your discharge or your ovaries.  If you change your mind about having the pelvic exam done, please return and we will reevaluate you at that point.  Otherwise, follow-up with your gynecologist.  Continue taking ibuprofen as needed for pain.  If necessary, you may add acetaminophen for additional pain relief.

## 2020-09-15 NOTE — ED Provider Notes (Signed)
[facility] Provider Note   CSN: [id number] Arrival date & time: 09/14/20  1935   History Chief Complaint  Patient presents with  . Abdominal Pain    [facility] is a 20 y.o. female.  The history is provided by the patient.  Abdominal Pain She has history of hypertension, schizophrenia and comes in complaining of suprapubic pain and low back pain for the last 2 weeks.  She rates pain at 7/10.  Nothing makes it better, nothing makes it worse.  She has taken ibuprofen which does give fairly good, temporary relief.  She has also noted a pink vaginal discharge during the same timeframe.  She has irregular menses and last menses was 4 months ago.  She is not using any contraception.  She has noted some urinary frequency and mild nausea, but denies breast swelling or tenderness.  She denies pain radiating into hips or legs.  She denies any numbness or tingling.  Past Medical History:  Diagnosis Date  . ADHD (attention deficit hyperactivity disorder)   . Anxiety   . Bacterial vaginosis 09/06/2014  . Contraceptive education 11/01/2013  . Ganglion cyst   . Hallucination   . Hypertension   . Ingrown left greater toenail 05/20/2013  . Lives in group home Oct 06, 2015  . Schizophrenia [facility])     Patient Active Problem List   Diagnosis Date Noted  . Ganglion cyst 09/16/2019  . Vaginal yeast infection 08/24/2019  . Severe recurrent major depression without psychotic features (HCC) 01/28/2019  . Encounter for Nexplanon removal 2017-03-20  . Missed periods 2017-03-20  . Death of parent 2015/10/06  . Schizoaffective disorder (HCC) 07/29/2015  . Screening examination for STD (sexually transmitted disease) 11/01/2013  . ADHD (attention deficit hyperactivity disorder), combined type 11/08/2012  . Major depressive disorder, recurrent episode, moderate (HCC) 11/05/2012  . Generalized anxiety disorder 11/05/2012    History reviewed. No pertinent surgical history.   OB  History    Gravida  0   Para  0   Term  0   Preterm  0   AB  0   Living  0     SAB  0   IAB  0   Ectopic  0   Multiple  0   Live Births  0           Family History  Problem Relation Age of Onset  . HIV/AIDS Mother   . Mental illness Mother   . Hypertension Mother   . Kidney disease Mother   . Mental illness Father   . HIV/AIDS Father   . Asthma Sister   . Diabetes Maternal Grandmother   . Hypertension Maternal Grandmother   . Hypertension Maternal Grandfather   . Mental illness Paternal Grandmother   . Mental illness Paternal Grandfather   . Mental illness Maternal Aunt   . Hypertension Maternal Aunt   . Diabetes Maternal Aunt   . Cancer Maternal Aunt   . Mental illness Paternal Aunt   . Mental illness Paternal Uncle     Social History   Tobacco Use  . Smoking status: Current Every Day Smoker    Packs/day: 0.25    Types: Cigarettes  . Smokeless tobacco: Never Used  Vaping Use  . Vaping Use: Former  Substance Use Topics  . Alcohol use: Not Currently  . Drug use: Not Currently    Types: Marijuana    Home Medications Prior to Admission medications   Medication Sig Start Date End Date Taking?  Authorizing Provider  clomiPHENE (CLOMID) 50 MG tablet Take 1 tablet (50 mg total) by mouth daily. Patient not taking: Reported on 03/13/2020 02/16/20   Cresenzo-Dishmon, Scarlette Calico, CNM  medroxyPROGESTERone (PROVERA) 10 MG tablet TAKE ONE TABLET BY MOUTH DAILY 03/15/20   Cresenzo-Dishmon, Scarlette Calico, CNM  Prenatal Vit-Fe Fumarate-FA (PNV PRENATAL PLUS MULTIVITAMIN) 27-1 MG TABS Take 1 tablet by mouth daily. 02/16/20   Cresenzo-Dishmon, Scarlette Calico, CNM    Allergies    Patient has no known allergies.  Review of Systems   Review of Systems  Gastrointestinal: Positive for abdominal pain.  All other systems reviewed and are negative.   Physical Exam Updated Vital Signs BP 111/70   Pulse 84   Temp 98.4 F (36.9 C) (Oral)   Resp 14   Ht 5\' 7"  (1.702 m)   Wt  110.9 kg   LMP  (LMP Unknown)   SpO2 100%   BMI 38.28 kg/m   Physical Exam Vitals and nursing note reviewed.   20 year old female, resting comfortably and in no acute distress. Vital signs are normal. Oxygen saturation is 100%, which is normal. Head is normocephalic and atraumatic. PERRLA, EOMI. Oropharynx is clear. Neck is nontender and supple without adenopathy or JVD. Back is mildly tender in the paralumbar area bilaterally with mild spasm present.  Straight leg raise is negative.  There is no midline tenderness.  There is no CVA tenderness. Lungs are clear without rales, wheezes, or rhonchi. Chest is nontender. Heart has regular rate and rhythm without murmur. Abdomen is soft, flat, with mild suprapubic tenderness.  There is no rebound or guarding.  There are no masses or hepatosplenomegaly and peristalsis is normoactive. Extremities have no cyanosis or edema, full range of motion is present. Skin is warm and dry without rash. Neurologic: Mental status is normal, cranial nerves are intact, there are no motor or sensory deficits.  ED Results / Procedures / Treatments   Labs (all labs ordered are listed, but only abnormal results are displayed) Labs Reviewed  URINALYSIS, ROUTINE W REFLEX MICROSCOPIC - Abnormal; Notable for the following components:      Result Value   APPearance TURBID (*)    Protein, ur 30 (*)    Bacteria, UA MANY (*)    All other components within normal limits  WET PREP, GENITAL  RPR  HIV ANTIBODY (ROUTINE TESTING W REFLEX)  POC URINE PREG, ED  GC/CHLAMYDIA PROBE AMP ([place]) NOT AT [facility]   Procedures Procedures   Medications Ordered in ED Medications - No data to display  ED Course  I have reviewed the triage vital signs and the nursing notes.  Pertinent lab results that were available during my care of the patient were reviewed by me and considered in my medical decision making (see chart for details).  MDM Rules/Calculators/A&P Pelvic  pain, back pain, vaginal discharge of uncertain cause.  Back pain is probably musculoskeletal.  Certainly, I am concerned about sexually transmitted infections in pregnancy.  Old records are reviewed, and she has prior ED visits for sexually transmitted infection, UTI, bacterial vaginosis.  Urinalysis is significant for bacteria but no significant number of WBCs.  Pregnancy test is negative.  Patient refused pelvic exam, so I am unable to assess her discharge.  Patient is referred back to her gynecologist and told to continue taking ibuprofen as needed for her back pain and pelvic pain.  Return to the emergency department if she changes her mind about having a pelvic exam done.  She is given  a short course of nitrofurantoin to treat possible cystitis.  Final Clinical Impression(s) / ED Diagnoses Final diagnoses:  Acute bilateral low back pain without sciatica  Pelvic pain  Cystitis    Rx / DC Orders ED Discharge Orders         Ordered    nitrofurantoin, macrocrystal-monohydrate, (MACROBID) 100 MG capsule  2 times daily        09/15/20 0516           Dione Booze, MD 09/15/20 [phone]

## 2020-10-17 ENCOUNTER — Telehealth: Payer: Self-pay | Admitting: *Deleted

## 2020-10-17 NOTE — Telephone Encounter (Signed)
Patient states she is wanting to start back on the Clomid.  Has "had some things going on and has not had blood levels checked".  Advised I would sent message to provider for review. PT

## 2020-10-18 ENCOUNTER — Other Ambulatory Visit: Payer: Self-pay | Admitting: Advanced Practice Midwife

## 2020-10-18 MED ORDER — CLOMIPHENE CITRATE 50 MG PO TABS: 50.0000 mg | ORAL_TABLET | Freq: Every day | ORAL | 5 refills | Status: DC

## 2020-10-18 MED ORDER — MEDROXYPROGESTERONE ACETATE 10 MG PO TABS: 10.0000 mg | ORAL_TABLET | Freq: Every day | ORAL | 5 refills | Status: DC

## 2020-10-18 NOTE — Progress Notes (Unsigned)
Pg challenge and clomid. See note from 7/21

## 2021-01-31 ENCOUNTER — Other Ambulatory Visit: Payer: Medicaid Other

## 2021-02-25 ENCOUNTER — Encounter: Payer: Self-pay | Admitting: Pediatrics

## 2021-06-03 ENCOUNTER — Emergency Department (HOSPITAL_COMMUNITY)
Admission: EM | Admit: 2021-06-03 | Discharge: 2021-06-03 | Discharge status: Against Medical Advice | Payer: Medicaid Other | Attending: Emergency Medicine | Admitting: Emergency Medicine

## 2021-06-03 ENCOUNTER — Other Ambulatory Visit: Payer: Self-pay

## 2021-06-19 ENCOUNTER — Other Ambulatory Visit (INDEPENDENT_AMBULATORY_CARE_PROVIDER_SITE_OTHER): Payer: Medicaid Other | Admitting: *Deleted

## 2021-06-19 ENCOUNTER — Other Ambulatory Visit: Payer: Self-pay

## 2021-06-19 ENCOUNTER — Other Ambulatory Visit (HOSPITAL_COMMUNITY)
Admission: RE | Admit: 2021-06-19 | Discharge: 2021-06-19 | Disposition: A | Discharge status: Home / Self Care | Payer: Medicaid Other | Source: Ambulatory Visit | Attending: Obstetrics & Gynecology | Admitting: Obstetrics & Gynecology

## 2021-06-19 DIAGNOSIS — Z113 Encounter for screening for infections with a predominantly sexual mode of transmission: Secondary | ICD-10-CM | POA: Insufficient documentation

## 2021-06-19 NOTE — Progress Notes (Signed)
Chart reviewed for nurse visit. Agree with plan of care.  Adline Potter, NP 06/19/2021 9:34 AM

## 2021-06-19 NOTE — Progress Notes (Signed)
   NURSE VISIT- VAGINITIS/STD  SUBJECTIVE:  [facility] is a 20 y.o. G0P0000 GYN patientfemale here for a vaginal swab for vaginitis screening, STD screen.  She reports the following symptoms: none for 0 days. Denies abnormal vaginal bleeding, significant pelvic pain, fever, or UTI symptoms.  OBJECTIVE:  There were no vitals taken for this visit.  Appears well, in no apparent distress  ASSESSMENT: Vaginal swab for vaginitis screening & STD screening.  PLAN: Self-collected vaginal probe for Gonorrhea, Chlamydia, Trichomonas, Bacterial Vaginosis, Yeast sent to lab Treatment: to be determined once results are received Follow-up as needed if symptoms persist/worsen, or new symptoms develop  Malachy Mood  06/19/2021 9:23 AM

## 2021-06-20 LAB — CERVICOVAGINAL ANCILLARY ONLY
Bacterial Vaginitis (gardnerella): POSITIVE — AB
Candida Glabrata: NEGATIVE
Candida Vaginitis: POSITIVE — AB
Chlamydia: NEGATIVE
Comment: NEGATIVE
Comment: NEGATIVE
Comment: NEGATIVE
Comment: NEGATIVE
Comment: NEGATIVE
Comment: NORMAL
Neisseria Gonorrhea: NEGATIVE
Trichomonas: NEGATIVE

## 2021-06-21 ENCOUNTER — Other Ambulatory Visit: Payer: Self-pay | Admitting: Adult Health

## 2021-06-21 MED ORDER — FLUCONAZOLE 150 MG PO TABS
ORAL_TABLET | ORAL | 1 refills | Status: DC
Start: 2021-06-21 — End: 2021-11-07

## 2021-06-21 MED ORDER — METRONIDAZOLE 500 MG PO TABS
500.0000 mg | ORAL_TABLET | Freq: Two times a day (BID) | ORAL | 0 refills | Status: DC
Start: 2021-06-21 — End: 2021-11-07

## 2021-06-21 NOTE — Progress Notes (Signed)
+  BV and yeast on vaginal swab, will rx flagyl and diflucan 

## 2021-11-07 ENCOUNTER — Other Ambulatory Visit (HOSPITAL_COMMUNITY)
Admission: RE | Admit: 2021-11-07 | Discharge: 2021-11-07 | Disposition: A | Discharge status: Home / Self Care | Payer: Medicaid Other | Source: Ambulatory Visit | Attending: Obstetrics & Gynecology | Admitting: Obstetrics & Gynecology

## 2021-11-07 ENCOUNTER — Other Ambulatory Visit: Payer: Self-pay

## 2021-11-07 ENCOUNTER — Other Ambulatory Visit (INDEPENDENT_AMBULATORY_CARE_PROVIDER_SITE_OTHER): Payer: Medicaid Other

## 2021-11-07 DIAGNOSIS — Z113 Encounter for screening for infections with a predominantly sexual mode of transmission: Secondary | ICD-10-CM | POA: Insufficient documentation

## 2021-11-07 DIAGNOSIS — N898 Other specified noninflammatory disorders of vagina: Secondary | ICD-10-CM | POA: Diagnosis present

## 2021-11-07 NOTE — Progress Notes (Signed)
? ?  NURSE VISIT- VAGINITIS/STD ? ?SUBJECTIVE:  ?[facility] is a 21 y.o. G0P0000 GYN patientfemale here for a vaginal swab for vaginitis screening, STD screen.  She reports the following symptoms: discharge described as white, curd-like, and malodorous, odor, and pain for 2 weeks. ?Denies abnormal vaginal bleeding, significant pelvic pain, fever, or UTI symptoms. ? ?OBJECTIVE:  ?There were no vitals taken for this visit.  ?Appears well, in no apparent distress ? ?ASSESSMENT: ?Vaginal swab for  vaginitis & STD screening ? ?PLAN: ?Self-collected vaginal probe for Gonorrhea, Chlamydia, Trichomonas, Bacterial Vaginosis, Yeast sent to lab ?Treatment: to be determined once results are received ?Follow-up as needed if symptoms persist/worsen, or new symptoms develop ? ?Telvin Reinders A Dhilan Brauer  ?11/07/2021 ?2:32 PM  ?

## 2021-11-11 ENCOUNTER — Other Ambulatory Visit: Payer: Self-pay | Admitting: Obstetrics & Gynecology

## 2021-11-11 LAB — CERVICOVAGINAL ANCILLARY ONLY
Bacterial Vaginitis (gardnerella): POSITIVE — AB
Candida Glabrata: NEGATIVE
Candida Vaginitis: NEGATIVE
Chlamydia: NEGATIVE
Comment: NEGATIVE
Comment: NEGATIVE
Comment: NEGATIVE
Comment: NEGATIVE
Comment: NEGATIVE
Comment: NORMAL
Neisseria Gonorrhea: NEGATIVE
Trichomonas: NEGATIVE

## 2021-11-11 MED ORDER — METRONIDAZOLE 500 MG PO TABS: 500.0000 mg | ORAL_TABLET | Freq: Two times a day (BID) | ORAL | 1 refills | Status: AC

## 2021-11-11 NOTE — Progress Notes (Signed)
Rx for flagyl due to BV 

## 2023-08-20 DIAGNOSIS — N760 Acute vaginitis: Secondary | ICD-10-CM | POA: Diagnosis not present

## 2023-09-17 DIAGNOSIS — F250 Schizoaffective disorder, bipolar type: Secondary | ICD-10-CM | POA: Diagnosis not present

## 2023-12-07 DIAGNOSIS — R42 Dizziness and giddiness: Secondary | ICD-10-CM | POA: Diagnosis not present

## 2023-12-07 DIAGNOSIS — R079 Chest pain, unspecified: Secondary | ICD-10-CM | POA: Diagnosis not present

## 2023-12-07 DIAGNOSIS — I10 Essential (primary) hypertension: Secondary | ICD-10-CM | POA: Diagnosis not present

## 2024-02-10 ENCOUNTER — Encounter: Payer: Self-pay | Admitting: Women's Health

## 2024-02-10 ENCOUNTER — Ambulatory Visit (INDEPENDENT_AMBULATORY_CARE_PROVIDER_SITE_OTHER): Admitting: Women's Health

## 2024-02-10 ENCOUNTER — Other Ambulatory Visit (HOSPITAL_COMMUNITY)
Admission: RE | Admit: 2024-02-10 | Discharge: 2024-02-10 | Disposition: A | Discharge status: Home / Self Care | Source: Ambulatory Visit | Attending: Women's Health | Admitting: Women's Health

## 2024-02-10 VITALS — BP 132/84 | HR 82 | Ht 67.0 in | Wt 287.0 lb

## 2024-02-10 DIAGNOSIS — E282 Polycystic ovarian syndrome: Secondary | ICD-10-CM | POA: Diagnosis not present

## 2024-02-10 DIAGNOSIS — Z124 Encounter for screening for malignant neoplasm of cervix: Secondary | ICD-10-CM

## 2024-02-10 DIAGNOSIS — Z319 Encounter for procreative management, unspecified: Secondary | ICD-10-CM

## 2024-02-10 DIAGNOSIS — Z6841 Body Mass Index (BMI) 40.0 and over, adult: Secondary | ICD-10-CM | POA: Diagnosis not present

## 2024-02-10 MED ORDER — MEDROXYPROGESTERONE ACETATE 10 MG PO TABS: 10.0000 mg | ORAL_TABLET | Freq: Every day | ORAL | 11 refills | Status: AC

## 2024-02-10 NOTE — Progress Notes (Signed)
   GYN VISIT Patient name: [facility] MRN [id number]  Date of birth: 28-Jan-2001 Chief Complaint:   talk about fertility issues  History of Present Illness:   [name] is a 23 y.o. G0P0000 African-American female being seen today for report of trying to get pregnant x 75yr. H/O irregular periods, was on clomid  in 2020 and decided to stop. Periods q 3-79mths, mostly just spots, sometimes has normal period. +hair neck/chest, no hair loss from head, no acne. +dark spots behind neck/under arms. Partner 33yo, has 5yo daughter.     No LMP recorded. (Menstrual status: Irregular Periods). The current method of family planning is none.  Last pap never. Results were: N/A      No data to display               No data to display           Review of Systems:   Pertinent items are noted in HPI Denies fever/chills, dizziness, headaches, visual disturbances, fatigue, shortness of breath, chest pain, abdominal pain, vomiting, abnormal vaginal discharge/itching/odor/irritation, problems with periods, bowel movements, urination, or intercourse unless otherwise stated above.  Pertinent History Reviewed:  Reviewed past medical,surgical, social, obstetrical and family history.  Reviewed problem list, medications and allergies. Physical Assessment:   Vitals:   02/10/24 1415  BP: 132/84  Pulse: 82  Weight: 287 lb (130.2 kg)  Height: 5' 7 (1.702 m)  Body mass index is 44.95 kg/m.       Physical Examination:   General appearance: alert, well appearing, and in no distress  Mental status: alert, oriented to person, place, and time  Skin: warm & dry   Cardiovascular: normal heart rate noted  Respiratory: normal respiratory effort, no distress  Abdomen: soft, non-tender   Pelvic: VULVA: normal appearing vulva with no masses, tenderness or lesions, VAGINA: normal appearing vagina with normal color and discharge, no lesions, CERVIX: normal appearing cervix without discharge or  lesions  Extremities: no edema   Chaperone: Peggy Dones  No results found for this or any previous visit (from the past 24 hours).  Assessment & Plan:  1) PCOS w/ oligomenorrhea> discussed, gave printed info on PCOS, PCOS diet, offered dietician referral-wants, orders placed. Discussed weight loss can help fertility  2) Desire for pregnancy> start pnv, start tracking periods, rx provera  x10d q28d if no spontaneous period and not pregnant. Take OPKs daily after period stops, let me know if no positives- will start femara.   3) Cervical cancer screen> pap today  Meds:  Meds ordered this encounter  Medications   medroxyPROGESTERone  (PROVERA ) 10 MG tablet    Sig: Take 1 tablet (10 mg total) by mouth daily. Every 28 days if no spontaneous period and negative pregnancy test    Dispense:  10 tablet    Refill:  11    Orders Placed This Encounter  Procedures   Amb ref to Medical Nutrition Therapy-MNT    Return in about 3 months (around 05/12/2024) for GYN f/u.  Suzen JONELLE Fetters CNM, WHNP-BC 02/10/2024 2:52 PM

## 2024-02-10 NOTE — Patient Instructions (Addendum)
 If you are trying to get pregnant:  Track periods on an app Take ovulation predictor kits every day after period stops until next period- let me know if not ovulating Take the provera  every 28 days to make yourself have a period if you don't start one on your own and you're not pregnant Have sex every other day on days 7-24 of your cycle (Day 1 is the 1st day of your period) [place] before sex Lay with your hips elevated on pillows for 20-6mins after sex Do not smoke or drink alcohol  Lose weight if you are overweight Take a prenatal vitamin with at least 400mcg of folic acid Decrease stress in your life  For Him:  Wear boxers instead of briefs Avoid hot baths/jacuzzi Vit C supplement Do not smoke or drink alcohol  Lose weight if you are overweight  Polycystic Ovary Syndrome  Polycystic ovarian syndrome (PCOS) is a common hormonal disorder among women of reproductive age. In most women with PCOS, small fluid-filled sacs (cysts) grow on the ovaries. PCOS can cause problems with menstrual periods and make it hard to get and stay pregnant. If this condition is not treated, it can lead to serious health problems, such as diabetes and heart disease. What are the causes? The cause of this condition is not known. It may be due to certain factors, such as: Irregular menstrual cycle. High levels of certain hormones. Problems with the hormone that helps to control blood sugar (insulin). Certain genes. What increases the risk? You are more likely to develop this condition if you: Have a family history of PCOS or type 2 diabetes. Are overweight, eat unhealthy foods, and are not active. These factors may cause problems with blood sugar control, which can contribute to PCOS or PCOS symptoms. What are the signs or symptoms? Symptoms of this condition include: Ovarian cysts and sometimes pelvic pain. Menstrual periods that are not regular or are too heavy. Inability to get or stay pregnant. Increased  growth of hair on the face, chest, stomach, back, thumbs, thighs, or toes. Acne or oily skin. Acne may develop during adulthood, and it may not get better with treatment. Weight gain or obesity. Patches of thickened and dark brown or black skin on the neck, arms, breasts, or thighs. How is this diagnosed? This condition is diagnosed based on: Your medical history. A physical exam that includes a pelvic exam. Your health care provider may look for areas of increased hair growth on your skin. Tests, such as: An ultrasound to check the ovaries for cysts and to view the lining of the uterus. Blood tests to check levels of sugar (glucose), female hormone (testosterone), and female hormones (estrogen and progesterone). How is this treated? There is no cure for this condition, but treatment can help to manage symptoms and prevent more health problems from developing. Treatment varies depending on your symptoms and if you want to have a baby or if you need birth control. Treatment may include: Making nutrition and lifestyle changes. Taking the progesterone hormone to start a menstrual period. Taking birth control pills to help you have regular menstrual periods. Taking medicines such as: Medicines to make you ovulate, if you want to get pregnant. Medicine to reduce extra hair growth. Having surgery in severe cases. This may involve making small holes in one or both of your ovaries. This decreases the amount of testosterone that your body makes. Follow these instructions at home: Take over-the-counter and prescription medicines only as told by your health care provider.  Follow a healthy meal plan that includes lean proteins, complex carbohydrates, fresh fruits and vegetables, low-fat dairy products, healthy fats, and fiber. If you are overweight, lose weight as told by your health care provider. Your health care provider can determine how much weight loss is best for you and can help you lose weight  safely. Keep all follow-up visits. This is important. Contact a health care provider if: Your symptoms do not get better with medicine. Your symptoms get worse or you develop new symptoms. Summary Polycystic ovarian syndrome (PCOS) is a common hormonal disorder among women of reproductive age. PCOS can cause problems with menstrual periods and make it hard to get and stay pregnant. If this condition is not treated, it can lead to serious health problems, such as diabetes and heart disease. There is no cure for this condition, but treatment can help to manage symptoms and prevent more health problems from developing. This information is not intended to replace advice given to you by your health care provider. Make sure you discuss any questions you have with your health care provider. Document Revised: 06/22/2023 Document Reviewed: 06/22/2023 Elsevier Patient Education  2024 [employer]   Diet for Polycystic Ovary Syndrome Polycystic ovary syndrome (PCOS) is a common hormonal disorder that affects a woman's reproductive system. It can cause problems with menstrual periods and make it hard to get and stay pregnant. Changing what you eat can help your hormones reach normal levels, improve your health, and help you better manage PCOS. Following a balanced diet can help you lose weight and improve the way that your body uses the hormone insulin to control blood sugar. This may include: Eating low-fat (lean) proteins, complex carbohydrates, fresh fruits and vegetables, low-fat dairy products, healthy fats, and fiber. Cutting down on calories. Exercising regularly. What are tips for following this plan? Follow a balanced diet for meals and snacks. Eat breakfast, lunch, dinner, and one or two snacks every day. Include protein in each meal and snack. Choose whole grains instead of products that are made with refined flour. Eat a variety of foods. Exercise regularly as told by your health care  provider. Aim to do at least 30 minutes of exercise on most days of the week. If you are overweight or obese: Pay attention to how many calories you eat. Cutting down on calories can help you lose weight. Work with your health care provider or a dietitian to figure out how many calories you need each day. What foods should I eat?  Fruits Include a variety of colors and types. All fruits are helpful for PCOS. Vegetables Include a variety of colors and types. All vegetables are helpful for PCOS. Grains Whole grains, such as whole wheat. Whole-grain breads, crackers, cereals, and pasta. Unsweetened oatmeal. Bulgur, barley, quinoa, and brown rice. Tortillas made from corn or whole-wheat flour. Meats and other proteins Lean proteins, such as fish, chicken, beans, eggs, and tofu. Dairy Low-fat dairy products, such as skim milk, cheese sticks, and yogurt. Beverages Low-fat or fat-free drinks, such as water , low-fat milk, sugar-free drinks, and small amounts of 100% fruit juice. Seasonings and condiments Ketchup. Mustard. Barbecue sauce. Relish. Low-fat or fat-free mayonnaise. Fats and oils Olive oil or canola oil. Walnuts and almonds. The items listed above may not be a complete list of recommended foods and beverages. Contact a dietitian for more options. What foods should I avoid? Foods that are high in calories or fat, especially saturated or trans fats. Fried foods. Sweets. Products that are  made from refined white flour, including white bread, pastries, white rice, and pasta. The items listed above may not be a complete list of foods and beverages to avoid. Contact a dietitian for more information. Summary PCOS is a hormonal imbalance that affects a woman's reproductive system. It can cause problems with menstrual periods and make it hard to get and stay pregnant. You can help to manage your PCOS by exercising regularly and eating a healthy, varied diet of vegetables, fruit, whole grains,  lean protein, and low-fat dairy products. Changing what you eat can improve the way that your body uses insulin, help your hormones reach normal levels, and help you lose weight. This information is not intended to replace advice given to you by your health care provider. Make sure you discuss any questions you have with your health care provider. Document Revised: 06/22/2023 Document Reviewed: 06/22/2023 Elsevier Patient Education  2024 [employer].

## 2024-02-16 LAB — CYTOLOGY - PAP
Adequacy: ABSENT
Chlamydia: NEGATIVE
Comment: NEGATIVE
Comment: NEGATIVE
Comment: NORMAL
Diagnosis: UNDETERMINED — AB
High risk HPV: NEGATIVE
Neisseria Gonorrhea: NEGATIVE

## 2024-02-17 ENCOUNTER — Ambulatory Visit: Payer: Self-pay | Admitting: Women's Health

## 2024-02-17 ENCOUNTER — Ambulatory Visit: Admitting: Nutrition

## 2024-02-17 DIAGNOSIS — R87619 Unspecified abnormal cytological findings in specimens from cervix uteri: Secondary | ICD-10-CM | POA: Insufficient documentation
